# Patient Record
Sex: Female | Born: 1970 | Race: White | Hispanic: No | Smoking: Never smoker
Health system: Southern US, Community
[De-identification: ages and names within clinical notes are randomized; demographics above are authoritative.]

## PROBLEM LIST (undated history)

## (undated) DIAGNOSIS — J189 Pneumonia, unspecified organism: Secondary | ICD-10-CM

## (undated) DIAGNOSIS — M1990 Unspecified osteoarthritis, unspecified site: Secondary | ICD-10-CM

## (undated) DIAGNOSIS — Z9889 Other specified postprocedural states: Secondary | ICD-10-CM

## (undated) DIAGNOSIS — E538 Deficiency of other specified B group vitamins: Secondary | ICD-10-CM

## (undated) DIAGNOSIS — R112 Nausea with vomiting, unspecified: Secondary | ICD-10-CM

## (undated) DIAGNOSIS — E039 Hypothyroidism, unspecified: Secondary | ICD-10-CM

## (undated) DIAGNOSIS — J329 Chronic sinusitis, unspecified: Secondary | ICD-10-CM

## (undated) DIAGNOSIS — R011 Cardiac murmur, unspecified: Secondary | ICD-10-CM

## (undated) DIAGNOSIS — Z889 Allergy status to unspecified drugs, medicaments and biological substances status: Secondary | ICD-10-CM

## (undated) DIAGNOSIS — I341 Nonrheumatic mitral (valve) prolapse: Secondary | ICD-10-CM

## (undated) HISTORY — PX: WISDOM TOOTH EXTRACTION: SHX21

## (undated) HISTORY — DX: Cardiac murmur, unspecified: R01.1

## (undated) HISTORY — DX: Unspecified osteoarthritis, unspecified site: M19.90

## (undated) HISTORY — DX: Chronic sinusitis, unspecified: J32.9

## (undated) HISTORY — DX: Allergy status to unspecified drugs, medicaments and biological substances: Z88.9

## (undated) HISTORY — DX: Nonrheumatic mitral (valve) prolapse: I34.1

## (undated) HISTORY — DX: Deficiency of other specified B group vitamins: E53.8

## (undated) HISTORY — PX: CYSTECTOMY: SUR359

## (undated) HISTORY — DX: Pneumonia, unspecified organism: J18.9

---

## 1992-06-16 HISTORY — PX: OTHER SURGICAL HISTORY: SHX169

## 1992-06-16 HISTORY — PX: TIBIA FRACTURE SURGERY: SHX806

## 1998-06-28 ENCOUNTER — Other Ambulatory Visit: Admission: RE | Admit: 1998-06-28 | Discharge: 1998-06-28 | Payer: Self-pay | Admitting: Obstetrics and Gynecology

## 1998-09-25 ENCOUNTER — Other Ambulatory Visit: Admission: RE | Admit: 1998-09-25 | Discharge: 1998-09-25 | Payer: Self-pay | Admitting: *Deleted

## 1998-10-23 ENCOUNTER — Inpatient Hospital Stay (HOSPITAL_COMMUNITY): Admission: AD | Admit: 1998-10-23 | Discharge: 1998-10-23 | Payer: Self-pay | Admitting: Obstetrics and Gynecology

## 1999-01-11 ENCOUNTER — Inpatient Hospital Stay (HOSPITAL_COMMUNITY): Admission: AD | Admit: 1999-01-11 | Discharge: 1999-01-11 | Payer: Self-pay | Admitting: Obstetrics and Gynecology

## 1999-02-09 ENCOUNTER — Inpatient Hospital Stay (HOSPITAL_COMMUNITY): Admission: AD | Admit: 1999-02-09 | Discharge: 1999-02-12 | Payer: Self-pay | Admitting: Obstetrics & Gynecology

## 1999-02-26 ENCOUNTER — Encounter (HOSPITAL_COMMUNITY): Admission: RE | Admit: 1999-02-26 | Discharge: 1999-05-27 | Payer: Self-pay | Admitting: Obstetrics & Gynecology

## 2000-01-21 ENCOUNTER — Other Ambulatory Visit: Admission: RE | Admit: 2000-01-21 | Discharge: 2000-01-21 | Payer: Self-pay | Admitting: Obstetrics and Gynecology

## 2000-02-13 ENCOUNTER — Encounter: Admission: RE | Admit: 2000-02-13 | Discharge: 2000-02-13 | Payer: Self-pay | Admitting: Family Medicine

## 2000-02-27 ENCOUNTER — Encounter: Admission: RE | Admit: 2000-02-27 | Discharge: 2000-02-27 | Payer: Self-pay | Admitting: Family Medicine

## 2000-02-27 ENCOUNTER — Encounter: Payer: Self-pay | Admitting: Family Medicine

## 2000-05-15 ENCOUNTER — Encounter (INDEPENDENT_AMBULATORY_CARE_PROVIDER_SITE_OTHER): Payer: Self-pay | Admitting: *Deleted

## 2000-05-15 ENCOUNTER — Ambulatory Visit (HOSPITAL_BASED_OUTPATIENT_CLINIC_OR_DEPARTMENT_OTHER): Admission: RE | Admit: 2000-05-15 | Discharge: 2000-05-15 | Payer: Self-pay | Admitting: Otolaryngology

## 2002-06-15 ENCOUNTER — Encounter: Payer: Self-pay | Admitting: Otolaryngology

## 2002-06-15 ENCOUNTER — Encounter: Admission: RE | Admit: 2002-06-15 | Discharge: 2002-06-15 | Payer: Self-pay | Admitting: Otolaryngology

## 2002-06-28 ENCOUNTER — Other Ambulatory Visit: Admission: RE | Admit: 2002-06-28 | Discharge: 2002-06-28 | Payer: Self-pay | Admitting: Obstetrics and Gynecology

## 2003-11-22 ENCOUNTER — Other Ambulatory Visit: Admission: RE | Admit: 2003-11-22 | Discharge: 2003-11-22 | Payer: Self-pay | Admitting: Obstetrics and Gynecology

## 2004-11-27 ENCOUNTER — Other Ambulatory Visit: Admission: RE | Admit: 2004-11-27 | Discharge: 2004-11-27 | Payer: Self-pay | Admitting: Obstetrics and Gynecology

## 2005-06-16 DIAGNOSIS — J189 Pneumonia, unspecified organism: Secondary | ICD-10-CM

## 2005-06-16 HISTORY — DX: Pneumonia, unspecified organism: J18.9

## 2005-07-12 ENCOUNTER — Inpatient Hospital Stay (HOSPITAL_COMMUNITY): Admission: EM | Admit: 2005-07-12 | Discharge: 2005-07-15 | Payer: Self-pay | Admitting: Emergency Medicine

## 2005-08-11 ENCOUNTER — Ambulatory Visit: Payer: Self-pay | Admitting: Cardiology

## 2005-08-12 ENCOUNTER — Inpatient Hospital Stay (HOSPITAL_COMMUNITY): Admission: EM | Admit: 2005-08-12 | Discharge: 2005-08-21 | Payer: Self-pay | Admitting: Emergency Medicine

## 2005-08-15 ENCOUNTER — Ambulatory Visit: Payer: Self-pay | Admitting: Pulmonary Disease

## 2005-09-25 ENCOUNTER — Encounter (INDEPENDENT_AMBULATORY_CARE_PROVIDER_SITE_OTHER): Payer: Self-pay | Admitting: Specialist

## 2005-09-25 ENCOUNTER — Ambulatory Visit (HOSPITAL_COMMUNITY): Admission: RE | Admit: 2005-09-25 | Discharge: 2005-09-25 | Payer: Self-pay | Admitting: Otolaryngology

## 2006-06-16 HISTORY — PX: NASAL SINUS SURGERY: SHX719

## 2007-02-18 ENCOUNTER — Encounter (INDEPENDENT_AMBULATORY_CARE_PROVIDER_SITE_OTHER): Payer: Self-pay | Admitting: Obstetrics and Gynecology

## 2007-02-18 ENCOUNTER — Ambulatory Visit (HOSPITAL_COMMUNITY): Admission: RE | Admit: 2007-02-18 | Discharge: 2007-02-18 | Payer: Self-pay | Admitting: Obstetrics and Gynecology

## 2007-03-23 ENCOUNTER — Encounter: Admission: RE | Admit: 2007-03-23 | Discharge: 2007-03-23 | Payer: Self-pay | Admitting: Obstetrics and Gynecology

## 2007-10-22 ENCOUNTER — Encounter: Admission: RE | Admit: 2007-10-22 | Discharge: 2007-10-22 | Payer: Self-pay | Admitting: Obstetrics and Gynecology

## 2008-04-24 ENCOUNTER — Encounter: Admission: RE | Admit: 2008-04-24 | Discharge: 2008-04-24 | Payer: Self-pay | Admitting: Obstetrics and Gynecology

## 2009-07-25 ENCOUNTER — Ambulatory Visit (HOSPITAL_COMMUNITY): Admission: RE | Admit: 2009-07-25 | Discharge: 2009-07-25 | Payer: Self-pay | Admitting: Obstetrics and Gynecology

## 2010-04-08 ENCOUNTER — Ambulatory Visit: Payer: Self-pay | Admitting: Cardiology

## 2010-04-08 ENCOUNTER — Encounter: Payer: Self-pay | Admitting: Cardiology

## 2010-04-08 DIAGNOSIS — R05 Cough: Secondary | ICD-10-CM | POA: Insufficient documentation

## 2010-04-08 DIAGNOSIS — R072 Precordial pain: Secondary | ICD-10-CM | POA: Insufficient documentation

## 2010-04-08 DIAGNOSIS — R059 Cough, unspecified: Secondary | ICD-10-CM | POA: Insufficient documentation

## 2010-04-26 ENCOUNTER — Ambulatory Visit: Payer: Self-pay | Admitting: Internal Medicine

## 2010-04-30 ENCOUNTER — Telehealth: Payer: Self-pay | Admitting: Internal Medicine

## 2010-04-30 ENCOUNTER — Ambulatory Visit: Payer: Self-pay | Admitting: Internal Medicine

## 2010-05-01 ENCOUNTER — Telehealth: Payer: Self-pay | Admitting: Internal Medicine

## 2010-05-06 ENCOUNTER — Encounter: Payer: Self-pay | Admitting: Internal Medicine

## 2010-05-06 ENCOUNTER — Ambulatory Visit (HOSPITAL_COMMUNITY): Admission: RE | Admit: 2010-05-06 | Discharge: 2010-05-06 | Payer: Self-pay | Admitting: Internal Medicine

## 2010-05-29 ENCOUNTER — Telehealth: Payer: Self-pay | Admitting: Internal Medicine

## 2010-05-31 ENCOUNTER — Ambulatory Visit: Payer: Self-pay

## 2010-05-31 ENCOUNTER — Ambulatory Visit: Payer: Self-pay | Admitting: Cardiology

## 2010-06-13 ENCOUNTER — Encounter: Payer: Self-pay | Admitting: Internal Medicine

## 2010-07-16 NOTE — Assessment & Plan Note (Addendum)
Summary: ec6/ chestpain   Visit Type:  Initial Consult Primary Oluwatomisin Hustead:  Fuller Mandril  CC:  chest pain.  History of Present Illness: Over the past month and one half, has had episodes of chest pain, and she would get some tightness in the chest.  She would also note that heart rate was not regular.  She felt she could not deep breath, and she would notice pain in neck.  She had blood work at [employer].  She can be sitting at her desk, and her heart went into an irregular rhythm.  It will only last seconds.   She has an 40 year old.  She has an IUD.   She will have spots of pain in her back, and they will come and go.  She will feel like she cannot take a deep breath.  She has not been exercising for the past three months.  Her job is stressful.   She has had a productive cough for about a year.   Patient has never smoked.  She wants to see pulmonary medicine.  Of note, she had a complex admission in 2007 in which she presented with bilateral infiltrates thought in part related to her recurrent sinusitis.  She was on steroids, had steroid induced hyperglycemia, and required nearly three weeks of antibiotics during that hospitalization.  She has seen Dr. Doristine Counter.  The cough seems to be persistent, at times productive.  She has had four separate ENT MDs.  The chest pain is vague, somewhat worse with a breath, and associated with neck pain.  Prior echo here was normal in 2004.    Current Medications (verified): 1)  No Meds  Allergies (verified): 1)  ! * All Quinolones 2)  ! Erythromycin 3)  ! Zithromax 4)  ! Morphine  Past History:  Past Medical History: Last updated: 04/04/2010  Chest pain  Acute-on-chronic sinusitis.  Remote history of exercise-induced asthma.  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  The patient denies  anorexia, fever, weight loss, weight gain, vision loss, decreased hearing, hoarseness, syncope, dyspnea on exertion, peripheral edema, headaches, hemoptysis, abdominal pain, melena, hematochezia, severe indigestion/heartburn, hematuria, incontinence, suspicious skin lesions, difficulty walking, depression, abnormal bleeding, and enlarged lymph nodes.    Vital Signs:  Patient profile:   40 year old female Height:      62 inches Weight:      156 pounds BMI:     28.64 O2 Sat:      100 % on Room air Pulse rate:   57 / minute Pulse rhythm:   regular Resp:     18 per minute BP sitting:   110 / 68  (left arm) Cuff size:   large  Vitals Entered By: Vikki Ports (April 08, 2010 2:29 PM)  O2 Flow:  Room air  Physical Exam  General:  Well developed, well nourished, in no acute distress. Head:  normocephalic and atraumatic Eyes:  PERRLA/EOM intact; conjunctiva and lids normal. Neck:  Supple.  No def bruits noted.   Lungs:  No rales.  No definite wheezes today.   Heart:  PMI nondisplaced. Normal S1 and s2.  No def rub, or gallop Abdomen:  Bowel sounds positive; abdomen soft and non-tender without masses, organomegaly, or hernias noted. No hepatosplenomegaly. Pulses:  pulses normal in all 4 extremities Extremities:  No clubbing or cyanosis. Neurologic:  Alert and oriented x 3.   EKG  Procedure date:  04/08/2010  Findings:      SB. WNL.   Impression & Recommendations:  Problem # 1:  CHEST PAIN, PRECORDIAL (ICD-786.51) The chest pain is somewhat atypical, and her risk factor profile is not high.  She has had a prior echo which was normal.  Will do limited stress test and do echocardiogram to exclude RV enlargment, or other findings.  I think she would benefit from reevaluation by pulmonary medicine given her complex history with sinusitis, pneuomonia, and complex of symptoms.  Index of suspicion for CAD is not high, but will do screening treadmill, check CXR, and refer to pulmonary  medicine.  Reviewed with patient in detail  Orders: EKG w/ Interpretation (93000) Echocardiogram (Echo) Treadmill (Treadmill) Pulmonary Referral (Pulmonary) T-2 View CXR (71020TC)  Problem # 2:  COUGH (ICD-786.2) Will get CXR and refer to pulmonary.  Has one and one half year history of recurrent cough, multiple sinus surgeries.   Orders: EKG w/ Interpretation (93000) Echocardiogram (Echo) Treadmill (Treadmill) Pulmonary Referral (Pulmonary) T-2 View CXR (71020TC)  Patient Instructions: 1)  You have been referred to Dr Marchelle Gearing in Pulmonary.  2)  Your physician has requested that you have an echocardiogram.  Echocardiography is a painless test that uses sound waves to create images of your heart. It provides your doctor with information about the size and shape of your heart and how well your heart's chambers and valves are working.  This procedure takes approximately one hour. There are no restrictions for this procedure. 3)  Your physician has requested that you have an exercise tolerance test.  For further information please visit [email or web address]  Please also follow instruction sheet, as given. 4)  A chest x-ray will be obtained.   Appended Document: ec6/ chestpain Thanks Tom. I will see her. I will cc to [facility] my nurse  Appended Document: ec6/ chestpain pt set to see MR on 04-26-10.

## 2010-07-16 NOTE — Assessment & Plan Note (Signed)
Summary: per dr stuckey/asthma/mhh   Visit Type:  Initial Consult Copy to:  Dr. Riley Kill Primary Provider/Referring Provider:  Fuller Mandril, [facility] in [place], Dr Pollyann Kennedy - ENT, Dr. Marcelle Overlie - OB, Dr Aris Georgia - Allergist, Dr Chrissie Noa Ward - [facility]  CC:  Pulmonary Consult for chronic cough x 1 year. Marland Kitchen  History of Present Illness: IOV 04/26/2010: 40 year old referred by Dr. Riley Kill for question of asthma. Patient has chronic cough for over 1 year. Insidious onset. Productive. Asspociated sputum present that even at baseline is yellow or clear or green. She feels it is a chest sputum. Stable since onset. Cough worst in day. Never bothers her sleep. No nocturnal cough. cough is variable in intensity day by day basis. Overall rates cough as moderte in intensity. Constantly coughs at work - finds it embarrassing. No clear cut aggravating factors. No clear cut relieving factors. Denies associated wheezing, or dyspnea although does have associated atypical chest pains with palpitations (episodic) and chest tightness. Saw Dr. Riley Kill 04/08/2010 - stress test is pending later this month. She also recollects some neck pain with inspiration in October that was transitory and lasted a week  Of note, she has had 4 sinus surgeries. First one in 2004. Most recent one in 2010. Known chronic pan-sinusitis.  Recollects skin test for allergies at age 13 - POSITIVE per hx. Also age [age] played soccer  - would hyperventilate and MDIs would help. Diagnosed as Exercise Induced asthma. Also, feels constant sinus drainage. Denies GERD. Not on ace inhibitor. Also 2007 admitted for pneumonia and then had multiple drug allergies. Also, drinks lot of coffee daily but denies frank GERD symptoms  Preventive Screening-Counseling & Management  Alcohol-Tobacco     Smoking Status: never  Caffeine-Diet-Exercise     Caffeine use/day: 4 cups  Current Medications (verified): 1)  No Meds  Allergies  (verified): 1)  ! * All Quinolones 2)  ! Erythromycin 3)  ! Zithromax 4)  ! Morphine  Past History:  Past Medical History:  Chest pain...DR Riley Kill  Acute-on-chronic sinusitis. Remote history of exercise-induced asthma. Pneumonia in 2007, hospitla x 2 weeks Multiple drug allergies  - FActive - "nearly killed me" in 2007 at admission. 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  Community-acquired pneumonia.  2.  Acute-on-chronic sinusitis.  3.  Steroid-induced hyperglycemia.  4.  Remote history of exercise-induced asthma.  5.  Multiple medication allergies.  Family History:  Her father has diabetes. Her mother, maternal uncle, and  grandmother all have hypertension. She has a sister who had thyroid cancer. MOm  - COPD, smokes Dad - smokes  Social History: Drinks lot of coffee.  Does not drink alcohol.  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  Married 1 daughter - born 40. She is healthy Smoking Status:  never Caffeine use/day:  4 cups  Review of Systems       The patient complains of productive cough, non-productive cough, chest pain, irregular heartbeats, abdominal pain, and change in color of mucus.  The patient denies shortness of breath with activity, shortness of breath at rest, coughing up blood, acid heartburn, indigestion, loss of appetite, weight change, difficulty swallowing, sore throat, tooth/dental problems, headaches, nasal congestion/difficulty breathing through nose, sneezing, itching, ear ache,  anxiety, depression, hand/feet swelling, joint stiffness or pain, rash, and fever.    Vital Signs:  Patient profile:   40 year old female Height:      62 inches Weight:      159.25 pounds BMI:     29.23 O2 Sat:      99 % on Room air Temp:     98.3 degrees F oral Pulse rate:   77 / minute BP sitting:   120 / 80  (right arm) Cuff size:   regular  Vitals Entered By: Carron Curie CMA (April 26, 2010 3:31 PM)  O2 Flow:  Room air CC: Pulmonary Consult for chronic cough x 1 year.  Comments Medications reviewed with patient Carron Curie CMA  April 26, 2010 3:33 PM Daytime phone number verified with patient.    Physical Exam  General:  well developed, well nourished, in no acute distress Head:  normocephalic and atraumatic Eyes:  PERRLA/EOM intact; conjunctiva and sclera clear Ears:  TMs intact and clear with normal canals Nose:  no deformity, discharge, inflammation, or lesions Mouth:  no deformity or lesions Neck:  no masses, thyromegaly, or abnormal cervical nodes Chest Wall:  no deformities noted Lungs:  clear bilaterally to auscultation and percussion Heart:  regular rate and rhythm, S1, S2 without murmurs, rubs, gallops, or clicks Abdomen:  bowel sounds positive; abdomen soft and non-tender without masses, or organomegaly Msk:  no deformity or scoliosis noted with normal posture Pulses:  pulses normal Extremities:  no clubbing, cyanosis, edema, or deformity noted Neurologic:  CN II-XII grossly intact with normal reflexes, coordination, muscle strength and tone Skin:  intact without lesions or rashes Cervical Nodes:  no significant adenopathy Axillary Nodes:  no significant adenopathy Psych:  alert and cooperative; normal mood and affect; normal attention span and concentration   CXR  Procedure date:  04/08/2010  Findings:       Clinical Data: Cough.  Chest pain.    CHEST - 2 VIEW    Comparison: 09/24/2005    Findings: Heart size is normal.   Mediastinal shadows are normal.   The pulmonary vascularity is normal.  Lungs are clear.  No   effusions.  No bony abnormalities.    IMPRESSION:   Normal chest    Read By:  Thomasenia Sales,  M.D.   Released By:  Thomasenia Sales,  M.D.   Comments:      reviewed  Impression & Recommendations:  Problem # 1:  COUGH (ICD-786.2) Assessment New  sinus drainage, possible asthma, possible acid reflux are likely causes for your cough. Combined they are called LARYNGOPHARYGNEAL REFLUX (LPR)  PLAN   - start daily netti pot wash via the pot  - have full PFT breathing test - once this completed, ket [place] know  - depending on breathing test result, you might need additiional test called methacholine challenge test  - call if any questions  - followup depending on test results.  - eliminate coffee if you can for next month  Orders: Consultation Level V (32202)  Patient Instructions: 1)  sinus drainage, possible asthma, possible acid reflux are likely causes for your cough 2)   - start daily netti pot wash via the pot 3)   - have full PFT  breathing test - once this completed, ket [place] know 4)   - depending on breathing test result, you might need additiional test called methacholine challenge test  5)  - call if any questions 6)   - followup depending on test results. 7)   - eliminate coffee if you can for next month   Immunization History:  Influenza Immunization History:    Influenza:  historical (04/16/2010)  Pneumovax Immunization History:    Pneumovax:  historical (04/19/2007)

## 2010-07-16 NOTE — Progress Notes (Signed)
Summary: needs methacholine  Phone Note Call from Patient Call back at [phone]   Caller: Patient Call For: Anabel Lykins Summary of Call: calling for pft results Initial call taken by: Rickard Patience,  May 01, 2010 1:41 PM  Follow-up for Phone Call        I informed patient of MR's recs from phone note created last night by MR-will have MR call and discuss test results and recs with patient-pt will be in a training class from 230pm to 330pm today-please call the given number after 330pm. Thanks.Reynaldo Minium CMA  May 01, 2010 2:22 PM   Additional Follow-up for Phone Call Additional follow up Details #1::        pft normal.. She needs methacholine. I spoke to Dr. Aris Georgia who based on her memory of this Dr. Stevphen Rochester patient felt it would be okay for the same with informed consent. I spoke to patient. Explained she cannot be pregnant or have heart disease. - she agrees to both. She will have test. Explained our experience so far wiht testing. She should fu with me after test Additional Follow-up by: Kalman Shan MD,  May 01, 2010 6:34 PM

## 2010-07-16 NOTE — Miscellaneous (Signed)
Summary: Orders Update pft charges  Clinical Lists Changes  Orders: Added new Service order of Carbon Monoxide diffusing w/capacity (94720) - Signed Added new Service order of Lung Volumes (94240) - Signed Added new Service order of Spirometry (Pre & Post) (94060) - Signed 

## 2010-07-16 NOTE — Progress Notes (Signed)
Summary: pft normal  Phone Note Outgoing Call   Summary of Call: PFT normal. Needs methacholine challenge. Called her home number - lmtcb. Called office - did not leave message. Please inform her she needs methacholine. IF she wants to discuss that test, I can explain it to her. cannot be pregnant for test. Also, I ned to talk to her allergist Dr. Aris Georgia tomorrow first before ordering methacholine. Can you get hold of her for me please Initial call taken by: Kalman Shan MD,  April 30, 2010 5:59 PM  Follow-up for Phone Call        will anser in other phone note. d/.w Dr Aris Georgia - who based on her recollection ofpatient who is actually Dr Stevphen Rochester patient feels tht with informed consent should be okay to do methacholine challenge test Follow-up by: Kalman Shan MD,  May 01, 2010 3:17 PM

## 2010-07-18 NOTE — Progress Notes (Signed)
Summary: MCT results in look-at-  Phone Note Other Incoming   Caller: Dr. Marchelle Gearing Summary of Call: Spoke with MR and he asked the pt was supposed to have methacoline test but he has not seen any results for this. According to order MCT was done on 05-06-10. I called pulmonary dept and they are faxing over MCT results. I have placd this in MR look-at. I will forward message to him so he is aware. Initial call taken by: Carron Curie CMA,  May 29, 2010 1:43 PM  Follow-up for Phone Call        methacholine challenge 05/06/2010 is POSITIVE for asthma. Gave results to her on cell phoone. She does not want MDI now. Wants fu after new years due to trip upcoming. Pls give her fu appt Follow-up by: Kalman Shan MD,  May 31, 2010 5:31 PM  Additional Follow-up for Phone Call Additional follow up Details #1::        LMTCBx1. Carron Curie CMA  June 04, 2010 10:45 AM pt returned call from [place]. Tivis Ringer, CNA  June 04, 2010 11:41 AM  LMTCBx2. Carron Curie CMA  June 06, 2010 2:52 PM  LMTCBx3.Carron Curie CMA  June 11, 2010 5:12 PM     Additional Follow-up for Phone Call Additional follow up Details #2::    I will sign off on message and send pt a letter. Carron Curie CMA  June 13, 2010 5:26 PM

## 2010-07-18 NOTE — Letter (Addendum)
Summary: Generic Electronics engineer Pulmonary  [address]   [place], [place] [address]   Phone: [phone]  Fax: [phone]    06/13/2010  [facility] [address] [place], [place]  [address]  Dear [name],    We have been attempting to contact you to schedule a follow-up appointment to discuss test results. Please contact our office at your earliest convenience. Thank you.           Sincerely,   [facility] Pulmonary Department  Appended Document: Generic Letter [name] [name], she is yet to make fu appt. Pls call and try to connect with her  Appended Document: Generic Letter pt seen by TP 2.13.12 for acute visit.  started on symbicort 160-4.5mcg and proair.  told to f/u w/ MR in 1 week > appt sched 2.24.12.

## 2010-07-26 ENCOUNTER — Ambulatory Visit: Payer: Self-pay | Admitting: Internal Medicine

## 2010-07-29 ENCOUNTER — Ambulatory Visit (INDEPENDENT_AMBULATORY_CARE_PROVIDER_SITE_OTHER)
Admission: RE | Admit: 2010-07-29 | Discharge: 2010-07-29 | Disposition: A | Discharge status: Home / Self Care | Payer: 59 | Source: Ambulatory Visit | Attending: Internal Medicine | Admitting: Internal Medicine

## 2010-07-29 ENCOUNTER — Ambulatory Visit (INDEPENDENT_AMBULATORY_CARE_PROVIDER_SITE_OTHER): Payer: 59 | Admitting: Adult Health

## 2010-07-29 ENCOUNTER — Telehealth: Payer: Self-pay | Admitting: Adult Health

## 2010-07-29 ENCOUNTER — Encounter: Payer: Self-pay | Admitting: Adult Health

## 2010-07-29 ENCOUNTER — Other Ambulatory Visit: Payer: Self-pay | Admitting: Internal Medicine

## 2010-07-29 DIAGNOSIS — R05 Cough: Secondary | ICD-10-CM

## 2010-07-29 DIAGNOSIS — R059 Cough, unspecified: Secondary | ICD-10-CM

## 2010-07-29 DIAGNOSIS — J45901 Unspecified asthma with (acute) exacerbation: Secondary | ICD-10-CM

## 2010-07-30 DIAGNOSIS — J45901 Unspecified asthma with (acute) exacerbation: Secondary | ICD-10-CM | POA: Insufficient documentation

## 2010-08-05 ENCOUNTER — Ambulatory Visit (INDEPENDENT_AMBULATORY_CARE_PROVIDER_SITE_OTHER): Payer: 59 | Admitting: Internal Medicine

## 2010-08-05 ENCOUNTER — Encounter: Payer: Self-pay | Admitting: Internal Medicine

## 2010-08-05 DIAGNOSIS — J45901 Unspecified asthma with (acute) exacerbation: Secondary | ICD-10-CM

## 2010-08-07 NOTE — Progress Notes (Signed)
Summary: side effects from breathign treatment  Phone Note Call from Patient   Caller: Patient Call For: Alliah Boulanger p Summary of Call: Patient had a breathing treatment this morning and she was dizzy when she was here but now she feels completed exhausted and she feels like she can barely keep her eyes open her lips were tingling but that is getting better. Patient can be reached at [phone] she wants to make sure this is normal Initial call taken by: Vedia Coffer,  July 29, 2010 1:32 PM  Follow-up for Phone Call        xopenex neb should not have caused that- can cause dizziness /tingling  encourage her to drink plently of fluids and rest  if not improving call back or ER if worse. Please contact office for sooner follow up if symptoms do not improve or worsen  xray was nml. Follow-up by: Rubye Oaks NP,  July 29, 2010 2:41 PM  Additional Follow-up for Phone Call Additional follow up Details #1::        pt advised of recs. Carron Curie CMA  July 29, 2010 3:04 PM

## 2010-08-07 NOTE — Assessment & Plan Note (Signed)
Summary: Acute NP office visit - asthmatic bronchitis   Copy to:  Dr. Riley Kill Primary Provider/Referring Provider:  Fuller Mandril, [facility] in [place], Dr Pollyann Kennedy - ENT, Dr. Marcelle Overlie - OB, Dr Aris Georgia - Allergist, Dr Chrissie Noa Ward - [facility]  CC:  wheezing, DOE, prod cough with green/yellow mucus, pain in mid back w/ inhalation x63month - was given augmentin, pred taper, and and hydromet by PCP 2days ago.  History of Present Illness: IOV 04/26/2010: 40 year old referred by Dr. Riley Kill for question of asthma. Patient has chronic cough for over 1 year. Insidious onset. Productive. Asspociated sputum present that even at baseline is yellow or clear or green. She feels it is a chest sputum. Stable since onset. Cough worst in day. Never bothers her sleep. No nocturnal cough. cough is variable in intensity day by day basis. Overall rates cough as moderte in intensity. Constantly coughs at work - finds it embarrassing. No clear cut aggravating factors. No clear cut relieving factors. Denies associated wheezing, or dyspnea although does have associated atypical chest pains with palpitations (episodic) and chest tightness. Saw Dr. Riley Kill 04/08/2010 - stress test is pending later this month. She also recollects some neck pain with inspiration in October that was transitory and lasted a week Of note, she has had 4 sinus surgeries. First one in 2004. Most recent one in 2010. Known chronic pan-sinusitis.  Recollects skin test for allergies at age 55 - POSITIVE per hx. Also age [age] played soccer  - would hyperventilate and MDIs would help. Diagnosed as Exercise Induced asthma. Also, feels constant sinus drainage. Denies GERD. Not on ace inhibitor. Also 2007 admitted for pneumonia and then had multiple drug allergies. Also, drinks lot of coffee daily but denies frank GERD symptoms  July 29, 2010 --Presents for an acute office visit. Complains of wheezing, DOE, prod cough with green/yellow  mucus, pain in mid back w/ inhalation x78month. Seen by PCP and  was given augmentin, pred taper, and hydromet  2days ago. Also seen by ENT this am. Labs done. Pt augmentin extended for supsected sinusitis and bronchitis.. Wheezing worse last 2 days. Denies chest pain,  orthopnea, hemoptysis,  n/v/d, edema, headache.Seen last ov with positive Methacholine test,however did not follow up for recommendations to schedule conflicts.  Medications Prior to Update: 1)  No Meds  Current Medications (verified): 1)  Symbicort 160-4.5 Mcg/act Aero (Budesonide-Formoterol Fumarate) .... 2 Puffs Two Times A Day 2)  Epipen 2-Pak 0.3 Mg/0.63ml Devi (Epinephrine) .... Use As Directed For Severe Allergic Reaction 3)  Proair Hfa 108 (90 Base) Mcg/act Aers (Albuterol Sulfate) .... 2 Puffs Every 4hr As Needed Wheezing  Allergies (verified): 1)  ! * All Quinolones 2)  ! Erythromycin 3)  ! Zithromax 4)  ! Morphine 5)  ! Rocephin  Past History:  Past Medical History: Last updated: 04/26/2010  Chest pain...DR Riley Kill  Acute-on-chronic sinusitis. Remote history of exercise-induced asthma. Pneumonia in 2007, hospitla x 2 weeks Multiple drug allergies  - FActive - "nearly killed me" in 2007 at admission. 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 Her mother, maternal uncle, and  grandmother all  have hypertension. She has a sister who had thyroid cancer. MOm  - COPD, smokes Dad - smokes  Social History: Last updated: 04/26/2010 Drinks lot of coffee.  Does not drink alcohol.  She works as an Scientist, water quality.  - Corporate treasurer  for [employer]  Patient never smoked.  Married 1 daughter - born 15. She is healthy  Past Pulmonary History:  Pulmonary History: DATE OF ADMISSION:  08/11/2005  DATE OF DISCHARGE:  08/21/2005  1.  Community-acquired pneumonia.  2.  Acute-on-chronic sinusitis.  3.  Steroid-induced hyperglycemia.  4.  Remote history of exercise-induced asthma.  5.  Multiple medication allergies.  Review of Systems      See HPI  Vital Signs:  Patient profile:   40 year old female Height:      62 inches Weight:      157.13 pounds BMI:     28.84 O2 Sat:      99 % on Room air Temp:     99.1 degrees F oral Pulse rate:   87 / minute BP sitting:   148 / 84  (left arm) Cuff size:   regular  Vitals Entered By: Boone Master CNA/MA (July 29, 2010 11:25 AM)  O2 Flow:  Room air CC: wheezing, DOE, prod cough with green/yellow mucus, pain in mid back w/ inhalation x37month - was given augmentin, pred taper, and hydromet by PCP 2days ago Is Patient Diabetic? No Comments Medications reviewed with patient Daytime contact number verified with patient. Boone Master CNA/MA  July 29, 2010 11:26 AM    Physical Exam  Additional Exam:  GEN: A/Ox3; pleasant , NAD HEENT:  [place]/AT, , EACs-clear, TMs-wnl, NOSE-clear, THROAT-clear NECK:  Supple w/ fair ROM; no JVD; normal carotid impulses w/o bruits; no thyromegaly or nodules palpated; no lymphadenopathy. RESP  Coarse BS w/ few exp wheeizng  CARD:  RRR, no m/r/g   GI:   Soft & nt; nml bowel sounds; no organomegaly or masses detected. [place]: Warm bil,  no calf tenderness edema, clubbing, pulses intact Neuro: intact w/no focal deficits noted.   Impression & Recommendations:  Problem # 1:  ASTHMA UNSPECIFIED WITH EXACERBATION (ICD-493.92) Exacerbation with associated bronchitis and sinusitis-slow to resolve Plan: Finish Augmentin and prednisone as directed.  Delsym as needed cough Begin Symbicort 160/4.92mcg 2 puffs two times a day -brush  rinse and gargle after use.  ProAir or Xopenex 2 puffs every 4hr as needed wheeizng or shortness of breath.  follow up 1 week Dr. Marchelle Gearing and as needed  Please contact office for sooner follow up if symptoms do not improve or worsen  Orders: Est. Patient Level IV (30865)  Medications Added to Medication List This Visit: 1)  Symbicort 160-4.5 Mcg/act Aero (Budesonide-formoterol fumarate) .... 2 puffs two times a day 2)  Epipen 2-pak 0.3 Mg/0.37ml Devi (Epinephrine) .... Use as directed for severe allergic reaction 3)  Proair Hfa 108 (90 Base) Mcg/act Aers (Albuterol sulfate) .... 2 puffs every 4hr as needed wheezing  Other Orders: T-2 View CXR (71020TC) Admin of Therapeutic Inj  intramuscular or subcutaneous (78469) Depo- Medrol 80mg  (J1040) Depo- Medrol 40mg  (J1030) Albuterol Sulfate Sol 1mg  unit dose (G2952) Nebulizer Tx (84132)  Patient Instructions: 1)  Finish Augmentin and prednisone as directed.  2)  Delsym as needed cough 3)  Begin Symbicort 160/4.27mcg 2 puffs two times a day -brush rinse and gargle after use.  4)  ProAir or Xopenex 2 puffs every 4hr as needed wheeizng or shortness of breath.  5)  follow up 1 week Dr. Marchelle Gearing and as needed  6)  Please contact office for sooner follow up if symptoms do not improve or worsen  Prescriptions: PROAIR HFA 108 (90 BASE) MCG/ACT AERS (ALBUTEROL SULFATE) 2 puffs every 4hr as needed wheezing  #1 x 2   Entered and Authorized by:   Rubye Oaks NP   Signed by:   Rubye Oaks NP on 07/29/2010   Method used:   Electronically to        [facility]* (retail)       [address]       [place], [place]  [id number]       Ph: [phone]       Fax: [phone]   RxID:   [id number]      Medication Administration  Injection # 1:    Medication: Depo- Medrol 40mg     Diagnosis: COUGH (ICD-786.2)    Route: IM    Site: RUOQ gluteus    Exp Date: 12-2012    Lot #: [id number]    Mfr: Pharmacia    Patient tolerated  injection without complications    Given by: Boone Master CNA/MA (July 29, 2010 12:37 PM)  Injection # 2:    Medication: Depo- Medrol 80mg     Diagnosis: COUGH (ICD-786.2)    Route: IM    Site: RUOQ gluteus    Exp Date: 12-2012    Lot #: [id number]    Mfr: Pharmacia    Patient tolerated injection without complications    Given by: Boone Master CNA/MA (July 29, 2010 12:37 PM)  Medication # 1:    Medication: Albuterol Sulfate Sol 1mg  unit dose    Diagnosis: COUGH (ICD-786.2)    Dose: 1 vial    Route: inhaled    Exp Date: 07-2011    Lot #: [id number]    Mfr: nephron    Patient tolerated medication without complications    Given by: Boone Master CNA/MA (July 29, 2010 12:38 PM)  Orders Added: 1)  T-2 View CXR [71020TC] 2)  Admin of Therapeutic Inj  intramuscular or subcutaneous [96372] 3)  Depo- Medrol 80mg  [J1040] 4)  Depo- Medrol 40mg  [J1030] 5)  Albuterol Sulfate Sol 1mg  unit dose [J7613] 6)  Nebulizer Tx [94640] 7)  Est. Patient Level IV [24401]

## 2010-08-09 ENCOUNTER — Ambulatory Visit: Payer: Self-pay | Admitting: Internal Medicine

## 2010-08-13 NOTE — Assessment & Plan Note (Signed)
Summary: congestion//jd   Visit Type:  IME Initial Copy to:  Dr. Riley Kill Primary Provider/Referring Provider:  Fuller Mandril, [facility] in [place], Dr Pollyann Kennedy - ENT, Dr. Marcelle Overlie - OB, Dr Aris Georgia - Allergist, Dr Chrissie Noa Ward - [facility], Dr Lucie Leather - Allergy, Dr [name] - ENT, Dr Marchelle Gearing - Pulmonary  CC:  Pt c/o low grade fever and pt c/o chest tightness and swelling i nher tongue due to abx.  pt also c/o palpatations as well since starting prednisone. [name] [name]  History of Present Illness: IOV 04/26/2010: 40 year old referred by Dr. Riley Kill for question of asthma. Patient has chronic cough for over 1 year. Insidious onset. Productive. Asspociated sputum present that even at baseline is yellow or clear or green. She feels it is a chest sputum. Stable since onset. Cough worst in day. Never bothers her sleep. No nocturnal cough. cough is variable in intensity day by day basis. Overall rates cough as moderte in intensity. Constantly coughs at work - finds it embarrassing. No clear cut aggravating factors. No clear cut relieving factors. Denies associated wheezing, or dyspnea although does have associated atypical chest pains with palpitations (episodic) and chest tightness. Saw Dr. Riley Kill 04/08/2010 - stress test is pending later this month. She also recollects some neck pain with inspiration in October that was transitory and lasted a week Of note, she has had 4 sinus surgeries. First one in 2004. Most recent one in 2010. Known chronic pan-sinusitis.  Recollects skin test for allergies at age 88 - POSITIVE per hx. Also age [age] played soccer  - would hyperventilate and MDIs would help. Diagnosed as Exercise Induced asthma. Also, feels constant sinus drainage. Denies GERD. Not on ace inhibitor. Also 2007 admitted for pneumonia and then had multiple drug allergies. Also, drinks lot of coffee daily but denies frank GERD symptoms  July 29, 2010 --Presents for an acute office visit.  Complains of wheezing, DOE, prod cough with green/yellow mucus, pain in mid back w/ inhalation x11month. Seen by PCP and  was given augmentin, pred taper, and hydromet  2days ago. Also seen by ENT this am. Labs done. Pt augmentin extended for supsected sinusitis and bronchitis.. Wheezing worse last 2 days. Denies chest pain,  orthopnea, hemoptysis,  n/v/d, edema, headache.Seen last ov with positive Methacholine test,however did not follow up for recommendations to schedule conflicts.  REC; [facility]  August 05, 2010: Followup afer NP visit one week ago. Still has few days of prednisone left. Taking symbicort. Feels asthma part and wheeze and cough are better. But felt she has reacted to augmenting with dyspnea, throat closure, tongue swelling and chest tightness. She has stopped it. She is due to see Dr. Lucie Leather to discus her allergy issues. There is some associated palpitations too.    Preventive Screening-Counseling & Management  Alcohol-Tobacco     Smoking Status: never  Caffeine-Diet-Exercise     Caffeine use/day: 4 cups  Current Medications (verified): 1)  Symbicort 160-4.5 Mcg/act Aero (Budesonide-Formoterol Fumarate) .... 2 Puffs Two Times A Day 2)  Epipen 2-Pak 0.3 Mg/0.71ml Devi (Epinephrine) .... Use As Directed For Severe Allergic Reaction 3)  Proair Hfa 108 (90 Base) Mcg/act Aers (Albuterol Sulfate) .... 2 Puffs Every 4hr As Needed Wheezing 4)  Nasonex 50 Mcg/act Susp (Mometasone Furoate) .... 2 Sprays Each Nostril Daily 5)  Prednisone 20 Mg Tabs (Prednisone) .... Take 1 Tablet By Mouth Once A Day  Allergies (verified): 1)  ! * All Quinolones 2)  ! Erythromycin 3)  !  Zithromax 4)  ! Morphine 5)  ! Rocephin 6)  ! Augmentin  Past History:  Past medical, surgical, family and social histories (including risk factors) reviewed, and no changes noted (except as noted below).  Past Medical History: Reviewed history from 04/26/2010 and no changes required.  Chest  pain...DR Riley Kill  Acute-on-chronic sinusitis. Remote history of exercise-induced asthma. Pneumonia in 2007, hospitla x 2 weeks Multiple drug allergies  - FActive - "nearly killed me" in 2007 at admission. Was in ICU  - Rocephin - 2007 - tongue swelling  - Zpak - rash in 2010  Past Surgical History: Reviewed history from 04/26/2010 and no changes required. 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  Community-acquired pneumonia.  2.  Acute-on-chronic sinusitis.  3.  Steroid-induced hyperglycemia.  4.  Remote history of exercise-induced asthma.  5.  Multiple medication allergies.  Family History: Reviewed history from 04/26/2010 and no changes required.  Her father has diabetes. Her mother, maternal uncle, and  grandmother all have hypertension. She has a sister who had thyroid cancer. MOm  - COPD, smokes Dad - smokes  Social History: Reviewed history from 04/26/2010 and no changes required. Drinks lot of coffee.  Does not drink alcohol.  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  Married 1 daughter - born 87. She is healthy  Review of Systems       chest tightness  Vital Signs:  Patient profile:   40 year old female Height:      62 inches Weight:      157.06 pounds BMI:     28.83 O2 Sat:      97 % on Room air Temp:     98.7 degrees F oral Pulse rate:   81 / minute BP sitting:   138 / 86  (right arm) Cuff size:   regular  Vitals Entered By: Carron Curie CMA (August 05, 2010 3:44 PM)  O2 Flow:  Room air CC: Pt c/o low grade fever, pt c/o chest tightness and swelling i nher tongue due to abx.   pt also c/o palpatations as well since starting prednisone.  Comments Medications reviewed with patient Carron Curie CMA  August 05, 2010 3:44 PM Daytime phone number verified with patient.    Physical Exam  General:  well developed, well nourished, in no acute distress Head:  normocephalic and atraumatic Eyes:  PERRLA/EOM intact; conjunctiva and sclera clear Ears:  TMs intact and clear with normal canals Nose:  no deformity, discharge, inflammation, or lesions Mouth:  no deformity or lesions Neck:  no masses, thyromegaly, or abnormal cervical nodes Chest Wall:  no deformities noted Lungs:  clear bilaterally to auscultation and percussion Heart:  regular rate and rhythm, S1, S2 without murmurs, rubs, gallops, or clicks Abdomen:  bowel sounds positive; abdomen soft and non-tender without masses, or organomegaly Msk:  no deformity or scoliosis noted with normal posture Pulses:  pulses normal Extremities:  no clubbing, cyanosis, edema, or deformity noted Neurologic:  CN II-XII grossly intact with normal reflexes, coordination, muscle strength and tone Skin:  intact without lesions or rashes Cervical Nodes:  no significant adenopathy Axillary Nodes:  no significant  adenopathy Psych:  alert and cooperative; normal mood and affect; normal attention span and concentration   Impression & Recommendations:  Problem # 1:  ASTHMA UNSPECIFIED WITH EXACERBATION (ICD-493.92) Assessment Improved improved with symbicort and prednisone but having dyspnea/chest tightness componenet independent of asthma that she feels is allregic reaction to augmenting. She is due to see Dr. Lucie Leather to discuss her allergies. In view of reported palpitations I will change symbicort to qvar and see if this makes a difference. Will see her again in 3 months  Medications Added to Medication List This Visit: 1)  Nasonex 50 Mcg/act Susp (Mometasone furoate) .... 2 sprays each nostril daily 2)  Prednisone 20 Mg  Tabs (Prednisone) .... Take 1 tablet by mouth once a day 3)  Qvar 80 Mcg/act Aers (Beclomethasone dipropionate) .... Two  puffs twice daily  Other Orders: Est. Patient Level II ([address]) Prescription Created Electronically [phone])  Patient Instructions: 1)  stop symbicort 2)  take qvar 2 puff two times a day - show technique to my nurse 3)  finish prednisone 4)  return in 3 months to report progress 5)  return sooner if needed Prescriptions: QVAR 80 MCG/ACT  AERS (BECLOMETHASONE DIPROPIONATE) Two  puffs twice daily  #1 x 6   Entered and Authorized by:   Kalman Shan MD   Signed by:   Kalman Shan MD on 08/05/2010   Method used:   Electronically to        [facility]* (retail)       [address]       [place], [place]  [id number]       Ph: [phone]       Fax: [phone]   RxID:   [id number]

## 2010-09-04 LAB — CBC
HCT: 43.3 % (ref 36.0–46.0)
Hemoglobin: 14.1 g/dL (ref 12.0–15.0)
MCHC: 32.5 g/dL (ref 30.0–36.0)
MCV: 89.3 fL (ref 78.0–100.0)
Platelets: 239 10*3/uL (ref 150–400)
RBC: 4.85 MIL/uL (ref 3.87–5.11)
RDW: 13.3 % (ref 11.5–15.5)
WBC: 8.3 10*3/uL (ref 4.0–10.5)

## 2010-09-04 LAB — PREGNANCY, URINE: Preg Test, Ur: NEGATIVE

## 2010-09-06 NOTE — Assessment & Plan Note (Signed)
[facility]                                ON-CALL NOTE  [name] [name], [name] [name]                      MRN:          [id number] DATE:08/04/2010                            DOB:          [birth year]   PRIMARY CARE PROVIDER:  Dr. Marchelle Gearing.  [name] [name] called the on-call number to note that she has been having some tingling in her throat and some tightness in her chest and possibly some welts on her neck which she associates with taking the Augmentin. This has occurred repeatedly over the past week and it has never progressed to the point that she had significant throat swelling or difficulty breathing.  She has multiple listed allergies and claims that this is similar to in the past.  I instructed the patient to take some Benadryl if the symptoms worsen, she should report immediately to the emergency room and by all means, she should stop taking Augmentin any further.  She already has a scheduled appointment for Dr. Marchelle Gearing next Friday, and she will call the clinic if that needs to be expedited.  It was again stressed to go to the emergency room if her breathing worsens.    Rogelia Rohrer, MD Electronically Signed   Clemetine Marker  DD: 08/04/2010  DT: 08/05/2010  Job #: [id number]

## 2010-09-27 ENCOUNTER — Other Ambulatory Visit: Payer: Self-pay | Admitting: Obstetrics and Gynecology

## 2010-09-27 DIAGNOSIS — N6311 Unspecified lump in the right breast, upper outer quadrant: Secondary | ICD-10-CM

## 2010-10-01 ENCOUNTER — Ambulatory Visit
Admission: RE | Admit: 2010-10-01 | Discharge: 2010-10-01 | Disposition: A | Discharge status: Home / Self Care | Payer: 59 | Source: Ambulatory Visit | Attending: Obstetrics and Gynecology | Admitting: Obstetrics and Gynecology

## 2010-10-01 DIAGNOSIS — N6311 Unspecified lump in the right breast, upper outer quadrant: Secondary | ICD-10-CM

## 2010-10-16 ENCOUNTER — Other Ambulatory Visit: Payer: 59

## 2010-10-29 NOTE — Op Note (Signed)
[name], [name] [name]               ACCOUNT NO.:  [id number]   MEDICAL RECORD NO.:  [id number]          PATIENT TYPE:  AMB   LOCATION:  [facility]                           FACILITY:  [facility]   PHYSICIAN:  Michelle L. Grewal, M.D.DATE OF BIRTH:  September 08, 1970   DATE OF PROCEDURE:  02/18/2007  DATE OF DISCHARGE:                               OPERATIVE REPORT   PREOPERATIVE DIAGNOSIS:  Left labial mass.   POSTOPERATIVE DIAGNOSIS:  Left labial mass.   PROCEDURE:  Removal left labial mass.   SURGEON:  Michelle L. Vincente Poli, M.D.   ANESTHESIA:  LMA.   FINDINGS:  A 1-cm left labial mass, sent to Pathology.   ESTIMATED BLOOD LOSS:  50 mL.   COMPLICATIONS:  None.   PROCEDURE:  Patient was taken to the operating room.  She was given  anesthesia.  She was prepped and draped in the usual sterile fashion.  An in-and-out catheter was used to empty the bladder.  Exam under  anesthesia revealed a left labial mass in approximately the 5 o'clock  position which was kind of deep that I could palpate more easily with my  finger in the vagina.  It did appear to be in a location of where the  Bartholin's gland should be.  I then decided the approach would be  easiest through the vagina, because the mass seemed to come up very  close to the vaginal epithelium.  I made a small 1.5-cm incision with a  scalpel and used Metzenbaum.  The mass appeared to be rubbery in  consistency and it was about the size of a marble and then a smaller  area attached to it as well.  I did lift it up with the Metzenbaum  scissors and removed it completely.  There was some bleeding at the base  naturally from all of the blood flow; we cauterized the base of the  incision and then closed it in a series of 2 layers using 2-0 chromic in  a running-locked stitch and then a 3-0 Vicryl running.  Exam under  anesthesia at that time revealed the mass had been completely removed.  On exam of the mass, it did not appear to be a Bartholin's cyst  or even  a sebaceous cyst, but we will send to Pathology for further analysis.  The patient tolerated the procedure well.  All sponge, lap and  instrument counts were correct x2.  The patient went to recovery room in  stable condition.      Michelle L. Vincente Poli, M.D.  Electronically Signed     MLG/MEDQ  D:  02/18/2007  T:  02/18/2007  Job:  [id number]

## 2010-11-01 NOTE — H&P (Signed)
[name] [name] [name], [name] [name]               ACCOUNT NO.:  [id number]   MEDICAL RECORD NO.:  [id number]          PATIENT TYPE:  EMS   LOCATION:  MAJO                         FACILITY:  [facility]   PHYSICIAN:  Lonia Blood, M.D.       DATE OF BIRTH:  09-May-1971   DATE OF ADMISSION:  07/11/2005  DATE OF DISCHARGE:                                HISTORY & PHYSICAL   PRIMARY CARE PHYSICIAN:  Dr. Doristine Counter   CHIEF COMPLAINT:  Rash.   HISTORY OF PRESENT ILLNESS:  40 year old woman with extensive allergy  history who comes into [facility] Emergency Room after she developed a rash  on her body.  About a week ago she went to see Dr. Gerilyn Pilgrim for sinusitis and  she received a prescription for fluoroquinolone antibiotic, prednisone, and  calcium.  She was taking them for about a week until last night when she  developed a rash on her body.  She came into the emergency room today to get  help.  In the emergency room she received Pepcid, Benadryl, and intravenous  dose of Solu-Medrol.  While she was receiving the Solu-Medrol she developed  significant shortness of breath and strider and feeling that someone was  strangling her.  The Solu-Medrol infusion was stopped and she improved  somehow breathing-wise.  By the time I am examining her she does not  complain of any shortness of breath or feeling that she cannot breathe.   PAST MEDICAL HISTORY:  1.  Extensive allergies.  2.  Sinusitis status post sinus surgery.   HOME MEDICATIONS:  1.  Factive.  2.  Prednisone pack.  3.  Calcium 1000 twice a day.  4.  Mevacor.   FAMILY HISTORY:  Father has allergy to shellfish.  Mother has allergy to  soy, penicillin and she had life-threatening allergic reaction to sulfur  medications.   SOCIAL HISTORY:  She does not smoke cigarettes.  Does not drink alcohol.  She works as an Scientist, water quality.   ALLERGIES:  Patient is allergic to AMPICILLIN, ERYTHROMYCIN, MORPHINE.   REVIEW OF SYSTEMS:  Positive for watery eyes and she  reports that her organs  feel like they are on fire.   PHYSICAL EXAMINATION:  VITAL SIGNS:  Temperature 99.1, pulse 79,  respirations 18, blood pressure 142/93, saturation 99% on room air.  GENERAL:  She is a well-developed, well-nourished woman in no acute  distress.  Alert and oriented to place, person, and time.  HEENT:  Head is normocephalic, atraumatic.  Eyes have pupils are equal,  round, and reactive to light and accommodation.  Extraocular movements  intact.  Her face is covered in a macular rash.  Her throat is clear.  Her  tongue has some slight edema but she is able to protrude the tongue out of  the mouth.  The uvula is in the midline and does not appear to have edema.  NECK:  Supple without JVD.  There is no strider and there is no pseudo  wheezing.  CHEST:  Clear to auscultation bilaterally without wheezes, rhonchi, or  crackles.  HEART:  Regular rate  and rhythm without murmurs, rubs, or gallops.  ABDOMEN:  Soft, nontender.  Bowel sounds are present.  EXTREMITIES:  No edema.  SKIN:  Widespread rash over the face, arms, and upper part of the body.  NEUROLOGIC:  Nonfocal.  PSYCHIATRIC:  Patient appears to have intact memory, mood.   ASSESSMENT/PLAN:  Allergic reaction.  I do suspect this is secondary to  fluoroquinolone.  I cannot rule out steroid-induced allergic reaction,  although I highly doubt it.  I have consulted by phone with Dr. Lucie Leather who  recommended oral Pepcid, Zyrtec, Singulair, and an Epipen on-hand.  Also, I  have consulted with ear, nose, and throat for the strider.  Plan is to  observe Ms. Harr for a few hours in the emergency room and if her  breathing stays well then we are going to plan for a discharge home with an  Epipen.      Lonia Blood, M.D.  Electronically Signed     SL/MEDQ  D:  07/11/2005  T:  07/11/2005  Job:  [id number]   cc:   Marjory Lies, M.D.  Fax: [phone]

## 2010-11-01 NOTE — Consult Note (Signed)
[name] [name], [name] [name]               ACCOUNT NO.:  [id number]   MEDICAL RECORD NO.:  [id number]          PATIENT TYPE:  EMS   LOCATION:  MAJO                         FACILITY:  [facility]   PHYSICIAN:  Antony Contras, MD     DATE OF BIRTH:  [birth year]   DATE OF CONSULTATION:  07/11/2005  DATE OF DISCHARGE:                                   CONSULTATION   CHIEF COMPLAINT:  Medication allergic reaction and stridor.   HISTORY OF PRESENT ILLNESS:  The patient is a 40 year old white female and a  patient of Dr. Gerilyn [name], who has a long history of sinus disease treated with  multiple rounds of antibiotics most recently consisting of Factive for 10  days and a steroid Prednisone dose pack.  She started that medication on  January 17 and has been on it for nine days.  At about 7:15 last night, she  developed a rash over her body with mild itching.  She took Benadryl last  night and was able to sleep.  This morning, she noticed some swelling of her  tongue, and slight change to her speech and came to the emergency  department.  In the emergency department, she was given Benadryl and Pepcid  and was feeling better, but was also given a half dose of Solu-Medrol  intravenously.  During administration, she suddenly felt short of breath and  had stridor.  The medication was stopped and she was given oxygen and  epinephrine, and quickly her airway symptoms cleared up.  Currently, she has  no trouble breathing, but does feel a little bit of lung discomfort.  She is  back to her baseline.  She still has postnasal drainage that is not clear,  but it is a lot better than it was.  She is scheduled for follow up with Dr.  Gerilyn [name] on February 1.  She is evaluated in the emergency department.   PAST SURGICAL HISTORY:  1.  Giant cell tumor extraction.  2.  Sinus surgery x2.   MEDICATIONS:  1.  Nasacort.  2.  A steroid Prednisone dose pack.  3.  Factive.   ALLERGIES:  1.  AMPICILLIN.  2.  ERYTHROMYCIN.  3.   MORPHINE.   SOCIAL HISTORY:  The patient denies alcohol use or smoking and has not had  any weight change.   FAMILY HISTORY:  Positive for allergies, cancer, high blood pressure, lung  disease, stroke and diabetes.   REVIEW OF SYSTEMS:  Negative except as per HPI.   PHYSICAL EXAMINATION:  VITAL SIGNS:  Temperature 99.1, blood pressure  143/93, pulse 79, respirations 18.  GENERAL:  The patient is in no acute distress, and was pleasant and  cooperative.  Voice and enunciation sounds normal.  SKIN:  The face, neck, torso, abdomen and proximal limbs are covered in a  bright red maculopapular rash.  EARS:  Tympanic membranes and external auditory canals are normal.  NOSE:  The nasal passages are clear with no evidence of polyps on anterior  exam.  ORAL CAVITY AND OROPHARYNX EXAM:  The tongue does not appear to edematous  and the flora of the mouth is normal in appearance.  There is no edema noted  in the oropharynx, or anywhere in the oral cavity or oropharynx.  There are  no other lesions noted.  NECK:  Neck is soft with no lymphadenopathy or mass.   LABORATORY:  White blood count 22 with 97% neutrophils (this was performed  after administration of steroids).   ASSESSMENT:  The patient is a 40 year old white female with a long history  of sinus disease with an apparent allergic reaction to a medication, likely  Factive.  The patient has a diffuse maculopapular rash as a result and is in  the emergency department due to some tongue swelling.  While in the  emergency department, she had an acute airway reaction to the administration  of Solu-Medrol.   PLAN:  I was consulted to evaluate her airway with the possibility of  letting her go home from the hospital because of too many patients and  because there was no rooms available in the hospital.  However, I do not  feel it is safe for her to go home regardless of what her airway looks like.  For evaluation of the airway, a fiberoptic  laryngoscopy will be performed.  However, my recommendation as passed along to the medical team that has seen  her already is that she be observed overnight to ensure that there is no  additional airway complications.  Clearly, she should stop Factive and we  discussed whether she should be on another antibiotic at present.  My  feeling is that she should allow her allergic reaction to resolve before  trying an additional antibiotic.  She should then keep follow up with Dr.  Gerilyn [name].      Antony Contras, MD  Electronically Signed     DDB/MEDQ  D:  07/11/2005  T:  07/12/2005  Job:  [id number]   cc:   Antony Contras, MD  Fax: [phone]

## 2010-11-01 NOTE — Discharge Summary (Signed)
[name], [name]               ACCOUNT NO.:  [id number]   MEDICAL RECORD NO.:  [id number]          PATIENT TYPE:  INP   LOCATION:  5501                         FACILITY:  [facility]   PHYSICIAN:  Isidor Holts, M.D.  DATE OF BIRTH:  Mar 05, 1971   DATE OF ADMISSION:  08/11/2005  DATE OF DISCHARGE:  08/21/2005                                 DISCHARGE SUMMARY   PMD:  [name] [name].   ENT:  [name], MD   DISCHARGE DIAGNOSES:  1.  Community-acquired pneumonia.  2.  Acute-on-chronic sinusitis.  3.  Steroid-induced hyperglycemia.  4.  Remote history of exercise-induced asthma.  5.  Multiple medication allergies.   DISCHARGE MEDICATIONS:  1.  Os-Cal with vitamin D 500 mg p.o. b.i.d.  2.  Allegra-D 180 mg p.o. daily.  3.  Nexium 40 mg p.o. b.i.d.  4.  Singulair 10 mg p.o. daily.  5.  Ambien CR 12.5 mg p.o. q.h.s.  6.  Prednisone 10 mg p.o. daily.  7.  Doxycycline 100 mg p.o. b.i.d. x5 days only.  8.  Asmanex Twisthaler 220 mcg 2 puffs q.h.s.  9.  Zyrtec 10 mg p.o. daily.   PROCEDURES:  1.  Chest x-ray dated August 11, 2005:  This showed bilateral lower lung      air space disease, likely representing pneumonia or aspiration.  There      was also mild vascular congestion.  2.  Two view chest x-ray dated August 14, 2005:  This showed no change in      right lower lobe air space disease.  There was resolution of the left      lung base air space disease.  3.  Chest CT scan dated August 14, 2005:  This showed nonspecific lower lung      ground glass opacities which may represent infection, inflammatory      changes, and/or atelectasis.  There was a tiny right pleural effusion.  4.  Facial maxillary CT scan dated August 14, 2005:  This showed acute-on-      chronic paranasal sinusitis.  5.  Chest x-ray, two view, dated August 19, 2005:  This showed interval      improvement in bibasilar infiltrates.  No significant residue or      infiltrate available by plain film.  6.   Facial maxillary CT scan dated August 19, 2005:  This showed postsurgical      change in the maxillary sinuses with bilateral maxillary sinus, left      sphenoid sinus, and ethmoid air cell disease.   CONSULTATIONS:  1.  Marcelyn Bruins, M.D., pulmonologist.  2.  [name], M.D., ENT.  3.  [name], M.D., allergy/immunology.   ADMISSION HISTORY:  As in H&P notes of August 11, 2005, dictated by Dr.  Threasa Beards. However, in brief, this is a 40 year old female, with known  history of chronic sinusitis, requiring surgery on at least two occasions.  Also, remote history of exercise-induced asthma.  In the past she has had  recurrent bouts of pneumonia, and is allergic to multiple medications,  including antibiotics.  The patient  has been under the care of Dr. [name] of ENT for her sinus problems and had in January, 2007 been commenced  on prednisone treatment, which she was still taking at the time of this  presentation.  She presents on this occasion with cough and chills, was  found to have pyrexia of 102.1 and was admitted for further evaluation,  investigation, and management.   CLINICAL COURSE:  1.  Community-acquired pneumonia:  The patient presents with pyrexia,      chills, and coughing.  Chest x-ray demonstrated bibasilar air space      disease.  She was therefore commenced on a combination of ceftriaxone      and azithromycin as well as bronchodilator nebulizers and oxygen      supplementation.  On August 13, 2005, the patient complained of      tingling of the lips, numbness of tongue.  She does have a history of      intolerance/allergy to QUINOLONES, PENICILLIN, ERYTHROMYCIN, MORPHINE,      SOLU-MEDROL, BENADRYL, and ATARAX.  Antibiotics were discontinued.  The      azithromycin only, was restarted on August 13, 2005.  By August 15, 2005, it was apparent that clinical response was not forthcoming.  The      patient was therefore switched to Primaxin.  Pulmonary  consultation was      called, which was kindly provided by Dr. Marcelyn Bruins, who recommended      continuing present antibiotic regimen.  Also recommended a steroid      taper.  Allergy/immunology was consulted.  Consultation was kindly      provided by Dr. [name], who also concurred with the impression of a      bilateral pneumonia but recommended CT scan of the sinus and chest for      additional evaluation and a vasculitis screen, including C-ANCA, P-ANCA,      and immune workup, including quantitative IgG, IgA, IgM, and IgE.  The      dilemma of course, is that as patient was on systemic steroids, any      results obtained at this time would be suspect only, especially if they      were negative, so it was recommended that these tests should be done      following weaning off steroids.  This should be about  three weeks after      patient is off steroids.  Be that as it may, the patient responded to      Primaxin and this was confirmed by a repeat chest x-ray of August 19, 2005, which showed complete resolution of pulmonary infiltrate.  The      patient was switched to oral doxycycline on August 20, 2005.  She is      expected to complete a further five-day course of this.   1.  Acute-on-chronic sinusitis:  The patient has a history of long-standing      sinus problems, and has in the past had surgical intervention.  Her ENT      surgeon is Dr. [name].  A maxillofacial CT scan dated August 19, 2005      showed postsurgical change in the maxillary sinuses and also bilateral      maxillary sinus, left sphenoid sinus and ethmoid air cell disease.  Dr.      [name] has been consulted.  She has reviewed the patient, and  her      conclusion/recommendation is that patient will require clearing out of      her of left sphenoid sinus and ethmoid air cells, however, this can be     carried out on an outpatient basis.  She has undertaken to contact      patient following discharge, to  arrange surgery.   1.  Chronic steroid treatment:  The patient was started on prednisone, I      believe, on June 22, 2005.  She at the time of presentation, was on 30      mg of prednisone a day.  Per pulmonology, rapid taper is indicated and      we are in the process of weaning her off.  As of August 20, 2005, she was      on 7.5 mg of prednisone b.i.d.  This has been changed to 10 mg p.o.      daily from August 21, 2005, and we have recommended that she continue      this, until seen by Dr. [name].  PMD is likely to play a leading      part in weaning her off this medication.   1.  Steroid-induced hyperglycemia:  This was managed with sliding-scale      insulin, during the course of the patient's hospital stay.  Certainly,      as steroids were gradually tapered.  Her CBG has normalized.   1.  History of exercise-induced asthma:  Per recommendations of Dr. Shelle Iron,      pulmonologist, the patient is to be continued on inhaled corticosteroids      as an outpatient.  Her Singulair was discontinued during the course of      her hospital stay.  Whether or not this should be recommenced, will be      deferred to the patient's primary MD.   DISPOSITION:  It is anticipated that the patient will be discharged on August 21, 2005, provided no new issues arise.  She is recommended to return to  regular duties on August 25, 2005.   DIET:  No restrictions.   ACTIVITY:  As tolerated.   WOUND CARE:  Not applicable.   PAIN MANAGEMENT:  Not applicable.   FOLLOW-UP INSTRUCTIONS:  Patient is instructed to follow up with her primary  MD at [facility] routinely, per prior scheduled  appointment.  She is to call for an appointment.  Per Dr. Judye Bos  service, patient will be contacted by Dr. [name] to arrange sinus  surgery.   SPECIAL INSTRUCTIONS:  Primary MD is recommended to arrange outpatient  pulmonary function testing and also to do vasculitic screening, including  P-  ANCA, C-ANCA.  An immunology profile with IgG, IgM, IgA, and IgE in  approximately 2-3 weeks following discontinuation of steroids.      Isidor Holts, M.D.  Electronically Signed     CO/MEDQ  D:  08/20/2005  T:  08/20/2005  Job:  [id number]   cc:   Marcelyn Bruins, M.D. [facility]  [address]  [place]  [place] [address]   [name], M.D.   [name], MD  Fax: [phone]

## 2010-11-01 NOTE — Op Note (Signed)
[name] [name], SCHLOTTMAN               ACCOUNT NO.:  [id number]   MEDICAL RECORD NO.:  [id number]          PATIENT TYPE:  EMS   LOCATION:  MAJO                         FACILITY:  [facility]   PHYSICIAN:  Antony Contras, MD     DATE OF BIRTH:  1971-05-01   DATE OF PROCEDURE:  07/11/2005  DATE OF DISCHARGE:                                 OPERATIVE REPORT   PREOPERATIVE DIAGNOSIS:  Acute airway reaction to medication with stridor.   POSTOPERATIVE DIAGNOSIS:  Acute airway reaction to medication with stridor.   OPERATION PERFORMED:  Transnasal fiberoptic laryngoscopy.   SURGEON:  Antony Contras, MD   ANESTHESIA:  Topical.   COMPLICATIONS:  None.   INDICATIONS FOR PROCEDURE:  The patient is a 40 year old white female is in  the emergency department due to a maculopapular rash that is felt to be an  allergic reaction to medication.  While in the emergency department she was  given Solu-Medrol and during IV administration, she developed acute stridor  and shortness of breath.  This responded to oxygen and epinephrine treatment  but airway evaluation was requested.   FINDINGS:  There was no evidence of edema or other lesion in the nasal  passages, nasopharynx, or pharynx or larynx.  Nasal polyps were not seen in  either nasal passage and there was absolutely no evidence of edema in the  upper airway or digestive tract.   DESCRIPTION OF PROCEDURE:  The patient was identified in the emergency  department and after informed consent was obtained, the right nasal passage  was dripped with 1% lidocaine.  Fiberoptic laryngoscope was then inserted  into the left and then right nasal passages to evaluate for nasal polyps.  Then the laryngoscope was passed down the right nasal passage through the  nasopharynx and down into the pharynx to view the pharynx and larynx.  Findings are noted above.  After this, the laryngoscope was removed and the  patient was returned to emergency room care without  complication.      Antony Contras, MD  Electronically Signed     DDB/MEDQ  D:  07/11/2005  T:  07/12/2005  Job:  [id number]

## 2010-11-01 NOTE — Op Note (Signed)
[name] [name], [name] [name]               ACCOUNT NO.:  [id number]   MEDICAL RECORD NO.:  [id number]          PATIENT TYPE:  AMB   LOCATION:  SDS                          FACILITY:  [facility]   PHYSICIAN:  [name], MD         DATE OF BIRTH:  1970/07/11   DATE OF PROCEDURE:  09/25/2005  DATE OF DISCHARGE:                                 OPERATIVE REPORT   PREOPERATIVE DIAGNOSIS:  Chronic pansinusitis.   POSTOPERATIVE DIAGNOSIS:  Chronic pansinusitis.   PROCEDURE:  Revision right total ethmoidectomy with right frontal recess  exploration, left frontal recess exploration, bilateral sphenoidotomies,  Stealth image guidance.   SURGEON:  Dr. [name].   ANESTHESIA:  General endotracheal anesthesia.   ESTIMATED BLOOD LOSS:  30 mL.   SPECIMENS:  Bilateral sinus contents.   COMPLICATIONS:  None.   INDICATIONS:  The patient is a 40 year old female who has previously  undergone bilateral maxillary antrostomies and ethmoidectomy as well as  septoplasty and bilateral inferior turbinate reductions.  Despite sinus  surgery, she continues to have a chronic sinus disease with recurrent  infection and mucosal thickening which has required prolonged antibiotic  therapy.  The patient has frequent nasal congestion as well.  Sinusitis has  been documented on CT scan.  She has been on prolonged steroid therapy in  part due to an allergic reaction to [facility].  Due to the ongoing infection,  the patient is taken to the operating room for procedure.   FINDINGS:  The patient was noted to have very minimal mucosal disease and no  evidence of infection.  There were some residual superior ethmoid cells on  the right, which had to be taken down.  The left ethmoid cavity was in good  condition and there was no revision necessary.  Both of the maxillary  antrotomy sites were widely patent as previously created.   PROCEDURE:  The patient was taken to the operating room and placed on the  table in the supine  position.  She was then placed under general  endotracheal anesthesia and the table rotated clockwise 90 degrees.  The  Stealth image guided system was applied to the forehead and the system  calibrated.  Each of the nasal cavities was decongested with Afrin on  cottonoid pledgets.  The patient was then draped in the usual fashion.  The  left side was performed first.   Lidocaine 1% with 1:100,000 of epinephrine was used to inject the left  lateral nasal wall in the area of the middle turbinate.  The middle  turbinate was also injected as was the anterior portion of the inlet to the  ethmoid cavity.  At this point, Stealth image guided system was used to  identify the landmarks of the previous surgery.  The sphenoidotomy was  performed through the ethmoid cavity.  The microdebrider was used to open up  the sphenoid sinus widely.  Once this was performed, attention was turned to  the frontal recess region.  Agger nasi cell was taken down and there was  some residual bony debris in the frontal recess  area.  The frontal sinus is  very small.  There was not a wide opening created, but rather the superior  ethmoid cells were taken down in this area to allow opening into the frontal  recess.  Attention was then turned to the right nasal cavity.  The injection  was performed in an identical fashion.  There were no adhesions between the  middle turbinate and lateral nasal wall as there were on the left.  This had  to be taken down on the left side.  On the right, the maxillary antrostomy  site was widely patent.  There were ethmoid air cells that had to be taken  down superiorly.  The amount of removal was done not significant, however,  ethmoid air cells had to be taken down laterally posterior to the maxillary  antrotomy site and posterior superiorly in very small amount.  Anterior  superiorly, there were a few more ethmoid small cells that had to be taken  down.  This allowed entry into the  frontal recess region with a very small  sinus cavity noted.  The 45 degree curved microdebrider was used for this  portion of the procedure as was the 45 degree endoscope.  The 0 degree  endoscope was used for the remainder of the procedure.  The sphenoidotomy  was created through the posterior ethmoid sinus wall and opened  significantly.  The sinus was without any evidence of infection or mucosal  swelling.  At this point, the sinus surgery was performed.  Rolled Gelfilm  was placed in each of the ethmoid cavities and Bactroban ointment injected  into both of the ethmoid cavities.  Nasal cavities were suctioned free of  blood as was the oral cavity.  The table was rotated counterclockwise 90  degrees to its original position.  The patient was awakened from anesthesia  and taken to the post anesthesia care unit in stable condition.  There were  no complications.      [name], MD  Electronically Signed     SJ/MEDQ  D:  09/25/2005  T:  09/25/2005  Job:  [id number]   cc:   Marjory Lies, M.D.  Fax: [phone]   Kerry Kass, M.D. [facility]  [address]  [place]  [place] [address]

## 2010-11-01 NOTE — Discharge Summary (Signed)
[name] [name], [name] [name]               ACCOUNT NO.:  [id number]   MEDICAL RECORD NO.:  [id number]          PATIENT TYPE:  INP   LOCATION:  2904                         FACILITY:  [facility]   PHYSICIAN:  Lonia Blood, M.D.      DATE OF BIRTH:  [birth year]   DATE OF ADMISSION:  07/11/2005  DATE OF DISCHARGE:                                 DISCHARGE SUMMARY   PRIMARY CARE PHYSICIAN:  Dr. Marjory Lies, [facility].   ENT:  Dr. Gerilyn Pilgrim.   DISCHARGE DIAGNOSES:  1.  Severe allergic reaction to Factive antibiotic, Benadryl and Solu-Medrol      intravenously with resultant transient angioedema.  2.  Leukocytosis.  3.  Rashes and severe itching secondary to allergic reaction.   DISCHARGE MEDICATIONS:  1.  Pepcid 20 mg p.o. twice daily.  2.  Prednisone taper starting at 60 mg p.o. twice daily for 3 days and      tapered over 18 days.  3.  Os-Cal +D 500 mg p.o. twice daily.  4.  Nasacort two puffs twice daily.   DISPOSITION:  The patient currently feels much better.  She is able to  swallow and eat without difficulties.  She will be followed by Dr. Gerilyn Pilgrim 4  days after discharge.  She will also follow with Dr. Doristine Counter, et. al.,  within the next 1-2 weeks.  She has been also advised to follow up with a  local allergist for further workup.   PROCEDURES PERFORMED:  Chest x-ray on July 11, 2005 showed no acute  cardiopulmonary disease.   CONSULTATIONS:  Social visit by Dr. Gerilyn Pilgrim, her ENT surgeon.   BRIEF HISTORY AND PHYSICAL:  Please refer to dictated history and physical  by Dr. Lonia Blood.  In short, however, the patient is a 40 year old female.  She has an extensive history of allergies.  She was started on Factive, a  new fluoroquinolone, antibiotic together with some prednisone and calcium  due to some sinusitis.  The patient had been taking Factive when all of a  sudden she started tapering the steroids down and started having allergic  reaction.  While in the ED, she was  seen and there was question of possible  angioedema as well.  She was having problem with swallowing as well as  extensive rash throughout all the body that blanched with pressure.  It was  quite clear with the rash and the itching, that the patient was experiencing  some allergic reaction.  She subsequently received IV Solu-Medrol in the ED  and her rashes and reaction worsened; hence, she was admitted for further  followup and observation.   HOSPITAL COURSE:  PROBLEM #1 - SEVERE ALLERGIC REACTION:  The patient seems  to be allergic to multiple medications.  She was initially put on IV  Benadryl and Pepcid and switched from IV Solu-Medrol due to prednisone due  to her initial reaction.  The patient continued to be allergic to IV  Benadryl and some reaction even on  the oral Benadryl.  She subsequently was taken off the Benadryl.  The dose  of prednisone was  raised to a higher dose of 60 mg p.o. q.6 h. but will  taper it through twice daily and down the way over an 18- to 20-day period.  Further followup will be by her primary care physician.      Lonia Blood, M.D.  Electronically Signed     LG/MEDQ  D:  07/13/2005  T:  07/14/2005  Job:  [id number]   cc:   Marjory Lies, M.D.  Fax: [phone]

## 2010-11-01 NOTE — Discharge Summary (Signed)
NAMEMAVERY, [name] [name]               ACCOUNT NO.:  [id number]   MEDICAL RECORD NO.:  [id number]          PATIENT TYPE:  INP   LOCATION:  2904                         FACILITY:  [facility]   PHYSICIAN:  Jacquelynn Cree, M.D.   DATE OF BIRTH:  1970-11-12   DATE OF ADMISSION:  07/11/2005  DATE OF DISCHARGE:  07/15/2005                                 DISCHARGE SUMMARY   PRIMARY CARE PHYSICIAN:  Dr. Juanita Craver with [facility].   EAR, NOSE AND THROAT PHYSICIAN:  Silvestre Moment, M.D.   DERMATOLOGIST:  Hope M. Tonia Brooms, M.D.   ALLERGIST:  Glory Buff, M.D.   DISCHARGE DIAGNOSES:  1.  Severe allergic reaction to __________  antibiotic with angioedema and      generalized pleuritic rash.  2.  Leukocytosis.  3.  Recent sinusitis.   DISCHARGE MEDICATIONS:  1.  Pepcid 20 mg b.i.d.  2.  Prednisone taper starting at 60 mg b.i.d. x3 days and tapering by 10 mg      every three days.  3.  Os-Cal plus D 500 mg b.i.d.  4.  Nasacort two puffs b.i.d.  5.  Claritin 10 mg daily.  6.  Ambien CR 12.5 mg q.h.s. p.r.n. sleep.  7.  EpiPen:  The patient instructed to keep this with her at all times.  8.  Singular 10 mg daily.   CONSULTATIONS:  1.  Dr. Edison Nasuti at [facility].  2.  Dr. Tonia Brooms of Dermatology.   HISTORY OF PRESENT ILLNESS:  The patient is a 40 year old female with past  medical history of multiple allergies and a family history of severe  allergic reactions who presented to the emergency department due to an  episode of angioedema with pleuritic rash.  She recently had been started on  a course of __________  antibiotic for treatment of an active case of  sinusitis.  She was given Solu-Medrol and Benadryl in the emergency  department and her symptoms got worse.  She refused any further treatment  with Benadryl and Solu-Medrol due to the temporal relationship of her  worsening symptoms with the administration of these medications.  She was  admitted for further evaluation and  observation.   PROCEDURE AND DIAGNOSTIC STUDIES:  Chest x-ray on July 11, 2005 showed no  acute cardiopulmonary disease.   HOSPITAL COURSE:  Problem 1. Severe allergic reaction with intermittent  angioedema and generalized pruritic rash.  The patient was initially put on  IV Benadryl and Solu-Medrol as well as Pepcid.  She refused any further Solu-  Medrol or Benadryl secondary to transient worsening of her symptoms after  administration of these medications.  The Benadryl and Solu-Medrol were  subsequently stopped and she was started on prednisone 60 mg q.6h. which was  tapered to b.i.d. by the time of her discharge.  She was continued on Pepcid  and put on Claritin as well as Singulair.  The patient was understandably  quite anxious with regard to her symptoms and what may have triggered them.  Given this, her dermatologist was consulted to provide some reassurance to  the patient.  She was  seen by Dr. Tonia Brooms on July 14, 2005 who recommended  a skin biopsy if the current rash continues to persist as the prednisone is  tapered off.  She did not have any other recommendations and did not feel  the patient had any evidence of vasculitis, erythema multiforme, toxic  epidermal necrolysis or Stevens-Johnson Syndrome.  The patient also had  several studies done to look for a possible link to hereditary angioedema.  Her complement studies have not been revealing.  At the time of this  dictation, the C2 level is pending as well as the C1 esterase and function  which Dr. Donneta Romberg of allergy and immunology suggested we checked for  completeness.  A telephone consult was made with Dr. Donneta Romberg who did not feel  that the patient needed to be seen acutely but recommended close follow-up  and a appointment was scheduled for her to be seen by her regular allergist,  Dr. Velora Heckler on July 22, 2005 at 2:15 p.m.  She also has a follow-up  allergy appointment on Thursday.  She plans to keep both of  those  appointments.  Additionally, other testing did not reveal any systemic  problems.  Her ESR was two.  Her rheumatoid factor was less than 20.  Her  anti nuclear antibody was negative.  She will follow up with Dr. Velora Heckler as  scheduled for further evaluation and ongoing treatment for her severe  allergy.  She was advised to carry her EpiPen with her at all times and to  use it if she had any sensation of swelling of the tongue or throat.  She  will see someone from the allergy and immunology department in two days and  I recommended she be evaluated by them before she starts to taper her  prednisone.  Problem 2. Sinusitis.  The patient recently had been treated for sinusitis  with __________ .  Dr. Ardis Hughs did see her in the hospital and recommended a  follow-up CT scan next week.  She agreed with keeping the patient off the  antibiotics at this point.  Problem 3. Leukocytosis.  The patient had leukocytosis which was felt to be  secondary to the demargination effect of steroids.  She should have a follow-  up CBC done by her primary care physician to ensure resolution once at  steroids were tapered off.   DISPOSITION:  The patient is discharged home.  She will follow up with Dr.  Ardis Hughs, Dr. Velora Heckler and her primary care physician.  She is instructed to  call 911 or return to the emergency department if she has any recurrent  severe allergic reactions.           ______________________________  Jacquelynn Cree, M.D.     CR/MEDQ  D:  07/15/2005  T:  07/15/2005  Job:  [id number]   cc:   Glory Buff, M.D. [facility]  [address]  [place]  [place] [address]   Juanita Craver, M.D.  Fax: [phone]   Silvestre Moment, MD  Fax: [place] M. Tonia Brooms, M.D.  Fax: [phone]

## 2010-11-01 NOTE — H&P (Signed)
NAMERALPHINE, HINKS               ACCOUNT NO.:  [id number]   MEDICAL RECORD NO.:  [id number]          PATIENT TYPE:  INP   LOCATION:  5501                         FACILITY:  [facility]   PHYSICIAN:  Nelma Rothman, MD   DATE OF BIRTH:  May 10, 1971   DATE OF ADMISSION:  08/11/2005  DATE OF DISCHARGE:                                HISTORY & PHYSICAL   PRIMARY CARE PHYSICIAN:  [name]   CHIEF COMPLAINT:  Cough and chills.   HISTORY OF PRESENT ILLNESS:  The patient is a 40 year old female complaining  of 3 days of cough which started on Friday, at which time it was mild and  nonproductive. However, today it got much worse, productive of yellow sputum  now. She also complains of shortness of breath and dyspnea. She had cold  chills at home but no fever of which she was aware until arrival in the  emergency department when her temperature was 102.1. She has been on  prednisone since June 22, 2005, for chronic sinusitis follwoing a severe  allergic reaction to fluoroquinolone.   ALLERGIES:  Anaphylactic reaction to FLUOROQUINOLONE.   MEDICATIONS:  1.  Prednisone as part of a taper. She is currently taking 20 mg q.a.m. and      10 mg q.p.m.  2.  Allegra D 180 mg daily.  3.  Zyrtec 10 mg daily.  4.  Nexium 40 mg b.i.d.  5.  Singulair 10 mg daily.  6.  Ambien CR 12.5 mg q.h.s.  7.  Calcium/vitamin D tablets 500 mg b.i.d.   PAST MEDICAL HISTORY:  1.  Chronic sinusitis.  2.  Recent anaphylactic reaction.   FAMILY HISTORY:  Her father has diabetes. Her mother, maternal uncle, and  grandmother all have hypertension. She has a sister who had thyroid cancer.   SOCIAL HISTORY:  She has a 40-year-old daughter. She denies any tobacco or  alcohol use.   PHYSICAL EXAMINATION:  VITAL SIGNS:  Temperature 102.1, pulse 120, blood  pressure 125/55, respiration rate 24, oxygen saturation 100% on 2 L.  GENERAL:  She is anxious and slightly dyspneic.  HEENT:  Mucous membranes  are moist. There was no evidence of thrush.  NECK:  Supple. There is no lymphadenopathy.  HEART:  Slightly tachycardic but no murmurs, rubs, or gallops.  LUNGS:  There are scattered rhonchi with mild wheezing throughout.  ABDOMEN:  Soft, nontender, nondistended with normoactive bowel sounds.  EXTREMITIES:  There is no edema.   LABORATORY DATA:  White blood cell count 13.8, hemoglobin 13.7, hematocrit  40.3, and platelet count 208. Sodium 135, potassium 2.9, chloride 102,  bicarb 25, BUN 10, creatinine 0.8, and glucose 176. Chest x-ray demonstrates  bibasilar airspace disease likely consistent with pneumonia or aspiration as  well as mild vascular congestion.   ASSESSMENT AND PLAN:  1.  Pneumonia which is community-acquired pneumonia complicated by      immunosuppression on prednisone. We will start ceftriaxone and      azithromycin as well as albuterol nebulizing treatments. If she is not      improved over the next 24-48  hours would need to consider opportunistic      infections such as PCP, given 2 months of high-dose prednisone use. She      would likely need bronchoscopy for BAL for diagnosis in this case. Will      check an LDH with a.m. labs.  2.  Chronic sinusitis. Will continue home medications including prednisone      taper.  3.  Hypokalemia of unclear etiology. Will replete and recheck in the a.m.  4.  Hyperglycemia, which is likely steroid induces. She has no known history      of diabetes. We will check a hemoglobin A1c and continue sliding scale      insulin while she is here in the hospital.      Nelma Rothman, MD  Electronically Signed     RAR/MEDQ  D:  08/12/2005  T:  08/12/2005  Job:  [id number]

## 2010-11-01 NOTE — Consult Note (Signed)
[name] [name], [name] [name]               ACCOUNT NO.:  [id number]   MEDICAL RECORD NO.:  [id number]          PATIENT TYPE:  INP   LOCATION:  5501                         FACILITY:  [facility]   PHYSICIAN:  Marcelyn Bruins, M.D. [facility] DATE OF BIRTH:  [birth year]   DATE OF CONSULTATION:  08/15/2005  DATE OF DISCHARGE:                                   CONSULTATION   HISTORY OF PRESENT ILLNESS:  The patient is a very complicated 40 year old  white female who I have been asked to see for an abnormal chest x-ray/CT  scan.  The patient has a history of exercise-induced asthma in the distant  past but has not required any treatment for asthma in quite some time.  She  has had difficulties over the years with chronic sinusitis requiring surgery  on at least two occasions.  She has had a few episodes of pneumonia probably  related to her chronic sinusitis.  The patient has been having a lot of  difficulties with very unusual reaction which as of yet has not been  characterized.  She has been to [facility], and the exact etiology  remains unknown.  Most recently, she has had great difficulties with acute  and chronic sinusitis, and Dr. Gerilyn Pilgrim has been trying to treat the patient  conservatively with steroids as well as antibiotics; however, most recently,  she was contemplating surgery.  The patient had absolutely no pulmonary  symptoms at all until this week when she began to develop cough with  purulent mucus that was scant in nature and tinged with blood, increased  chest congestion, worsening dyspnea, as well as fever.  She came to the  emergency room where she was found to have a fever of 101.  She was  subsequently admitted for treatment.  Her chest x-ray at that time showed  right middle and right lower lobe as well as left lower lobe airspace  disease.  The patient has been treated with azithromycin as well as Rocephin  and now is on azithromycin alone.  Her chest x-ray has definitely improved,  although she does have some residual right lower lung zone abnormality.  She  is also clinically better but has not returned to her usual baseline.  She  has had a CT scan of the chest which does show some very faint ground-glass  infiltrates in both lower lobes and more prominently in the middle lobe.  Clearly, this has improved when compared to her radiographic studies on  admission.  The patient has also been evaluated by an allergist in house,  and multiple serologic tests have been sent to consider vasculitis.  However, the patient's sed rate is quite low, although this may be a  manifestation of her high-dose steroids.   PAST MEDICAL HISTORY:  1.  Remote history of exercise-induced asthma.  2.  History of chronic sinusitis.  3.  Recent recurrent allergic reactions of unknown type in etiology.   ALLERGIES:  THE PATIENT IS INTOLERANT/ALLERGIC TO FLUOROQUINOLONES,  AMPICILLIN, ERYTHROMYCIN, MORPHINE, SOLU-MEDROL, BENADRYL, AND ATARAX.   SOCIAL HISTORY:  She is married and has a  71-year-old daughter.  She does not  smoke.   FAMILY HISTORY:  Remarkable for diabetes and hypertension as well as a  sister having thyroid cancer.   REVIEW OF SYSTEMS:  As per history of present illness.  Also, see further  documentation on the chart.   PHYSICAL EXAMINATION:  GENERAL:  She is an overweight white female in no  acute distress.  VITAL SIGNS:  Blood pressure 109/68, pulse 75, respiratory rate 20,  temperature 98.3.  HEENT:  Pupils equal, round, reactive to light and accommodation.  Extraocular muscles are intact.  Nares are patent without discharge.  Oropharynx is clear.  NECK:  Supple, without JVD or lymphadenopathy.  There is no palpable  thyromegaly.  CHEST:  Rhonchorous breath sounds in the right mid-lung zone that was focal  in nature.  CARDIAC:  Regular rate and rhythm with a 2/6 systolic murmur.  ABDOMEN:  Soft, nontender.  Good bowel sounds.  GU:  Not done, not indicated   RECTAL:  Not done, not indicated.  BREASTS:  Not done, not indicated.  EXTREMITIES:  Without significant edema, although there was probably trace  ankle edema present.  Pulses were intact distally.  NEUROLOGIC:  Alert and oriented without motor deficits.   IMPRESSION:  Pulmonary infiltrates of unknown etiology.  It is really  unclear whether these are infectious or inflammatory in nature.  This may  simply be a pneumonia related to her chronic sinus disease or something as  complicated as a sign of pulmonary syndrome related to an autoimmune  process.  She did have fever, purulent sputum, and an elevated white count,  and therefore, I suspect that this is pneumonia.  She has also improved with  antibiotics as well as moderate doses of prednisone.  It will be very  difficult to make a diagnosis of an autoimmune process given the doses of  prednisone that she has been on over time.  Again, because I think she is  clinically and radiographically improved that we may be on the right track  with treatment of her infection.  At this point in time, I would continue  her on antibiotics and try to get her prednisone very quickly to 5 to 10 mg  a day.  We know that she has acute/chronic sinusitis, and the best solution  may be to fix the problem.  That would at least allow [place] to see if we can  get her off of steroids and resolve her pulmonary symptoms.  Certainly,  recurrent sinusitis can cause both pneumonia and reactive airways.  I really  do not think this is an opportunistic pulmonary infection given her response  to the current treatment and the fact that she is improved clinically.   RECOMMENDATIONS:  1.  Would continue current antibiotic regimen.  2.  I would decrease her prednisone to 10 mg b.i.d. and quickly try to get      her to 10 mg a day.  3.  Would add inhaled corticosteroids to her nebulizer in the form of      Pulmicort 0.25 mg q.6h. 4.  I would strongly encourage ENT evaluation  and consider a surgical      approach to her sinus disease so we can put some of these issues to      rest.  5.  I would follow up her various pending serologies; however, I think they      will only be helpful if they are abnormal.  If they are normal,  I think      the results really mean nothing because of her history of high-dose      prednisone use.           ______________________________  Marcelyn Bruins, M.D. [facility]     KC/MEDQ  D:  08/15/2005  T:  08/17/2005  Job:  [id number]   cc:   Kerry Kass, M.D. [facility]  [address]  [place]  [place] [address]   [name], MD  Fax: [phone]   Miachel Roux L. Robb Matar, M.D.

## 2010-12-16 ENCOUNTER — Encounter: Payer: Self-pay | Admitting: Cardiology

## 2011-03-28 LAB — CBC
HCT: 41.3
Hemoglobin: 14.3
MCHC: 34.6
MCV: 85.2
Platelets: 249
RBC: 4.84
RDW: 12.6
WBC: 6.2

## 2011-03-28 LAB — HCG, SERUM, QUALITATIVE: Preg, Serum: NEGATIVE

## 2011-04-08 DIAGNOSIS — J3089 Other allergic rhinitis: Secondary | ICD-10-CM | POA: Insufficient documentation

## 2011-04-08 DIAGNOSIS — J309 Allergic rhinitis, unspecified: Secondary | ICD-10-CM | POA: Insufficient documentation

## 2011-04-21 DIAGNOSIS — M25569 Pain in unspecified knee: Secondary | ICD-10-CM | POA: Insufficient documentation

## 2011-04-21 DIAGNOSIS — D480 Neoplasm of uncertain behavior of bone and articular cartilage: Secondary | ICD-10-CM | POA: Insufficient documentation

## 2011-05-29 ENCOUNTER — Other Ambulatory Visit: Payer: Self-pay | Admitting: Obstetrics and Gynecology

## 2011-05-29 DIAGNOSIS — N63 Unspecified lump in unspecified breast: Secondary | ICD-10-CM

## 2011-06-06 ENCOUNTER — Ambulatory Visit
Admission: RE | Admit: 2011-06-06 | Discharge: 2011-06-06 | Disposition: A | Discharge status: Home / Self Care | Payer: 59 | Source: Ambulatory Visit | Attending: Obstetrics and Gynecology | Admitting: Obstetrics and Gynecology

## 2011-06-06 DIAGNOSIS — N63 Unspecified lump in unspecified breast: Secondary | ICD-10-CM

## 2011-06-20 DIAGNOSIS — J339 Nasal polyp, unspecified: Secondary | ICD-10-CM | POA: Insufficient documentation

## 2011-06-20 DIAGNOSIS — J330 Polyp of nasal cavity: Secondary | ICD-10-CM | POA: Insufficient documentation

## 2011-06-20 DIAGNOSIS — Z883 Allergy status to other anti-infective agents status: Secondary | ICD-10-CM | POA: Insufficient documentation

## 2011-07-24 DIAGNOSIS — R69 Illness, unspecified: Secondary | ICD-10-CM | POA: Insufficient documentation

## 2011-08-04 ENCOUNTER — Ambulatory Visit (HOSPITAL_BASED_OUTPATIENT_CLINIC_OR_DEPARTMENT_OTHER): Payer: 59 | Attending: Allergy and Immunology | Admitting: Radiology

## 2011-08-04 VITALS — Ht 62.0 in | Wt 164.0 lb

## 2011-08-04 DIAGNOSIS — G4710 Hypersomnia, unspecified: Secondary | ICD-10-CM | POA: Insufficient documentation

## 2011-08-04 DIAGNOSIS — R0989 Other specified symptoms and signs involving the circulatory and respiratory systems: Secondary | ICD-10-CM | POA: Insufficient documentation

## 2011-08-04 DIAGNOSIS — R0609 Other forms of dyspnea: Secondary | ICD-10-CM | POA: Insufficient documentation

## 2011-08-04 DIAGNOSIS — G4733 Obstructive sleep apnea (adult) (pediatric): Secondary | ICD-10-CM

## 2011-08-09 DIAGNOSIS — R0989 Other specified symptoms and signs involving the circulatory and respiratory systems: Secondary | ICD-10-CM

## 2011-08-09 DIAGNOSIS — G4710 Hypersomnia, unspecified: Secondary | ICD-10-CM

## 2011-08-09 DIAGNOSIS — G4730 Sleep apnea, unspecified: Secondary | ICD-10-CM

## 2011-08-09 DIAGNOSIS — R0609 Other forms of dyspnea: Secondary | ICD-10-CM

## 2011-08-09 NOTE — Procedures (Signed)
NAMEJAZIYAH, [name] [name]               ACCOUNT NO.:  [id number]  MEDICAL RECORD NO.:  [id number]          PATIENT TYPE:  OUT  LOCATION:  SLEEP CENTER                 [facility]ACILITY[facility]  [facility]  PHYSICIAN:  Rahel Carlton D. Maple Hudson, MD, FCCP, FACPDATE OF BIRTH:  11-Dec-1971  DATE OF STUDY:  08/04/2011                           NOCTURNAL POLYSOMNOGRAM  REFERRING PHYSICIAN:  Jessica Priest, M.D.  REFERRING PHYSICIAN:  Jessica Priest, MD.  INDICATION FOR STUDY:  Hypersomnia with sleep apnea.  EPWORTH SLEEPINESS SCORE:  6/24.  BMI 30.  Weight 164 pounds, height 62 inches, neck 11.5 inch.  MEDICATIONS:  Charted and reviewed.  SLEEP ARCHITECTURE:  Total sleep time 296.5 minutes with sleep efficiency 82.6%.  Stage I was 4.6%, stage II 62.4%, stage III 9.3%. REM 23.8% of total sleep time.  Sleep latency 5 minutes.  REM latency 59 minutes.  Awake after sleep onset 57.5 minutes.  Arousal index 26.7.  BEDTIME MEDICATION: 1. QVAR inhaler. 2. Montelukast.  Sleep was marked by a series of spontaneous waking between 1 and 2:30 a.m., nonspecific.  RESPIRATORY DATA:  Apnea-hypopnea index (AHI) 0.4 per hour.  A total of 2 events were scored, including 1 obstructive apnea and 1 hypopnea. These were nonpositional.  REM AHI 0.9 per hour.  There were insufficient numbers of events to permit application of split protocol, CPAP titration on the study night.  OXYGEN DATA:  Mild-to-moderate snoring with oxygen desaturation to a nadir of 92% and a mean oxygen saturation through the study of 96.3% on room air.  CARDIAC DATA:  Normal sinus rhythm.  MOVEMENT-PARASOMNIA:  No significant movement disturbance.  Bathroom x1.  IMPRESSIONS-RECOMMENDATIONS: 1. Sleep architecture was significant for several spontaneous wakings,     mainly between 1 and 2:30 a.m., unrelated to identified somatic     disturbance.  If this is a pattern in the home environment, then a     trial of treatment as insomnia might be helpful. 2. Rare  respiratory event with sleep disturbance, within normal     limits.  AHI 0.4 per hour with nonpositional events, mild-to-     moderate snoring, and normal oxygenation with a nadir of 92%     saturation and a mean oxygen saturation through the study of 96.3%     on room air.     Laquilla Dault D. Maple Hudson, MD, [facility], FACP Diplomate, American Board of Sleep Medicine    CDY/MEDQ  D:  08/09/2011 10:19:38  T:  08/09/2011 10:30:52  Job:  [id number]

## 2011-10-01 ENCOUNTER — Ambulatory Visit
Admission: RE | Admit: 2011-10-01 | Discharge: 2011-10-01 | Disposition: A | Discharge status: Home / Self Care | Payer: 59 | Source: Ambulatory Visit | Attending: Allergy and Immunology | Admitting: Allergy and Immunology

## 2011-10-01 ENCOUNTER — Other Ambulatory Visit: Payer: Self-pay | Admitting: Allergy and Immunology

## 2011-10-01 DIAGNOSIS — R059 Cough, unspecified: Secondary | ICD-10-CM

## 2011-10-01 DIAGNOSIS — R05 Cough: Secondary | ICD-10-CM

## 2011-10-08 ENCOUNTER — Other Ambulatory Visit: Payer: Self-pay | Admitting: Allergy and Immunology

## 2011-10-08 DIAGNOSIS — R059 Cough, unspecified: Secondary | ICD-10-CM

## 2011-10-08 DIAGNOSIS — I776 Arteritis, unspecified: Secondary | ICD-10-CM

## 2011-10-08 DIAGNOSIS — R05 Cough: Secondary | ICD-10-CM

## 2011-10-09 ENCOUNTER — Ambulatory Visit
Admission: RE | Admit: 2011-10-09 | Discharge: 2011-10-09 | Disposition: A | Discharge status: Home / Self Care | Payer: 59 | Source: Ambulatory Visit | Attending: Allergy and Immunology | Admitting: Allergy and Immunology

## 2011-10-09 DIAGNOSIS — I776 Arteritis, unspecified: Secondary | ICD-10-CM

## 2011-10-09 DIAGNOSIS — R059 Cough, unspecified: Secondary | ICD-10-CM

## 2011-10-09 DIAGNOSIS — R05 Cough: Secondary | ICD-10-CM

## 2011-10-09 MED ORDER — IOHEXOL 300 MG/ML  SOLN
75.0000 mL | Freq: Once | INTRAMUSCULAR | Status: AC | PRN
  Administered 2011-10-09: 75 mL via INTRAVENOUS

## 2011-10-21 ENCOUNTER — Encounter (HOSPITAL_BASED_OUTPATIENT_CLINIC_OR_DEPARTMENT_OTHER): Payer: Self-pay | Admitting: *Deleted

## 2011-10-21 ENCOUNTER — Ambulatory Visit (HOSPITAL_BASED_OUTPATIENT_CLINIC_OR_DEPARTMENT_OTHER)
Admission: EM | Admit: 2011-10-21 | Discharge: 2011-10-22 | Disposition: A | Discharge status: Home / Self Care | Payer: 59 | Attending: General Surgery | Admitting: General Surgery

## 2011-10-21 ENCOUNTER — Emergency Department (INDEPENDENT_AMBULATORY_CARE_PROVIDER_SITE_OTHER): Payer: 59

## 2011-10-21 DIAGNOSIS — R112 Nausea with vomiting, unspecified: Secondary | ICD-10-CM

## 2011-10-21 DIAGNOSIS — N889 Noninflammatory disorder of cervix uteri, unspecified: Secondary | ICD-10-CM

## 2011-10-21 DIAGNOSIS — K3580 Unspecified acute appendicitis: Secondary | ICD-10-CM | POA: Diagnosis present

## 2011-10-21 DIAGNOSIS — J329 Chronic sinusitis, unspecified: Secondary | ICD-10-CM | POA: Insufficient documentation

## 2011-10-21 DIAGNOSIS — R109 Unspecified abdominal pain: Secondary | ICD-10-CM

## 2011-10-21 DIAGNOSIS — K3533 Acute appendicitis with perforation and localized peritonitis, with abscess: Secondary | ICD-10-CM | POA: Insufficient documentation

## 2011-10-21 DIAGNOSIS — K389 Disease of appendix, unspecified: Secondary | ICD-10-CM

## 2011-10-21 LAB — URINALYSIS, ROUTINE W REFLEX MICROSCOPIC
Bilirubin Urine: NEGATIVE
Glucose, UA: NEGATIVE mg/dL
Hgb urine dipstick: NEGATIVE
Ketones, ur: >80 mg/dL — AB
Leukocytes, UA: NEGATIVE
Nitrite: NEGATIVE
Protein, ur: NEGATIVE mg/dL
Specific Gravity, Urine: 1.025 (ref 1.005–1.030)
Urobilinogen, UA: 0.2 mg/dL (ref 0.0–1.0)
pH: 6 (ref 5.0–8.0)

## 2011-10-21 LAB — CBC
HCT: 43.4 % (ref 36.0–46.0)
Hemoglobin: 14.8 g/dL (ref 12.0–15.0)
MCH: 30 pg (ref 26.0–34.0)
MCHC: 34.1 g/dL (ref 30.0–36.0)
MCV: 88 fL (ref 78.0–100.0)
Platelets: 229 10*3/uL (ref 150–400)
RBC: 4.93 MIL/uL (ref 3.87–5.11)
RDW: 12.4 % (ref 11.5–15.5)
WBC: 19.8 10*3/uL — ABNORMAL HIGH (ref 4.0–10.5)

## 2011-10-21 LAB — COMPREHENSIVE METABOLIC PANEL
ALT: 11 U/L (ref 0–35)
AST: 16 U/L (ref 0–37)
Albumin: 4.2 g/dL (ref 3.5–5.2)
Alkaline Phosphatase: 63 U/L (ref 39–117)
BUN: 12 mg/dL (ref 6–23)
CO2: 28 mEq/L (ref 19–32)
Calcium: 9.6 mg/dL (ref 8.4–10.5)
Chloride: 101 mEq/L (ref 96–112)
Creatinine, Ser: 0.8 mg/dL (ref 0.50–1.10)
GFR calc Af Amer: >90 mL/min (ref >90)
GFR calc non Af Amer: >90 mL/min (ref >90)
Glucose, Bld: 101 mg/dL — ABNORMAL HIGH (ref 70–99)
Potassium: 4 mEq/L (ref 3.5–5.1)
Sodium: 137 mEq/L (ref 135–145)
Total Bilirubin: 1.4 mg/dL — ABNORMAL HIGH (ref 0.3–1.2)
Total Protein: 6.9 g/dL (ref 6.0–8.3)

## 2011-10-21 LAB — DIFFERENTIAL
Basophils Absolute: 0 10*3/uL (ref 0.0–0.1)
Basophils Relative: 0 % (ref 0–1)
Eosinophils Absolute: 0 10*3/uL (ref 0.0–0.7)
Eosinophils Relative: 0 % (ref 0–5)
Lymphocytes Relative: 4 % — ABNORMAL LOW (ref 12–46)
Lymphs Abs: 0.8 10*3/uL (ref 0.7–4.0)
Monocytes Absolute: 1.2 10*3/uL — ABNORMAL HIGH (ref 0.1–1.0)
Monocytes Relative: 6 % (ref 3–12)
Neutro Abs: 17.8 10*3/uL — ABNORMAL HIGH (ref 1.7–7.7)
Neutrophils Relative %: 90 % — ABNORMAL HIGH (ref 43–77)

## 2011-10-21 LAB — PREGNANCY, URINE: Preg Test, Ur: NEGATIVE

## 2011-10-21 LAB — LIPASE, BLOOD: Lipase: 27 U/L (ref 11–59)

## 2011-10-21 MED ORDER — SODIUM CHLORIDE 0.9 % IV SOLN
Freq: Once | INTRAVENOUS | Status: AC
  Administered 2011-10-21: 21:00:00 via INTRAVENOUS

## 2011-10-21 MED ORDER — HYDROMORPHONE HCL PF 1 MG/ML IJ SOLN
1.0000 mg | Freq: Once | INTRAMUSCULAR | Status: AC
  Administered 2011-10-21: 1 mg via INTRAVENOUS
  Filled 2011-10-21: qty 1

## 2011-10-21 MED ORDER — METRONIDAZOLE IN NACL 5-0.79 MG/ML-% IV SOLN
500.0000 mg | Freq: Once | INTRAVENOUS | Status: AC
  Administered 2011-10-22: 500 mg via INTRAVENOUS
  Filled 2011-10-21: qty 100

## 2011-10-21 MED ORDER — MORPHINE SULFATE 4 MG/ML IJ SOLN: 4.0000 mg | Freq: Once | INTRAMUSCULAR | Status: DC

## 2011-10-21 MED ORDER — KETOROLAC TROMETHAMINE 30 MG/ML IJ SOLN
30.0000 mg | Freq: Once | INTRAMUSCULAR | Status: AC
  Administered 2011-10-21: 30 mg via INTRAVENOUS
  Filled 2011-10-21: qty 1

## 2011-10-21 MED ORDER — IOHEXOL 300 MG/ML  SOLN
100.0000 mL | Freq: Once | INTRAMUSCULAR | Status: AC | PRN
  Administered 2011-10-21: 100 mL via INTRAVENOUS

## 2011-10-21 MED ORDER — ONDANSETRON HCL 4 MG/2ML IJ SOLN
4.0000 mg | Freq: Once | INTRAMUSCULAR | Status: AC
  Administered 2011-10-21: 4 mg via INTRAVENOUS
  Filled 2011-10-21: qty 2

## 2011-10-21 NOTE — ED Notes (Signed)
Pt reports sudden onset of nausea and diarrhea around 130pm and was followed by numerous episodes of vomiting at 4pm today. Here for continued N/V/D. Denies fevers.

## 2011-10-21 NOTE — ED Provider Notes (Signed)
History     CSN: [id number]  Arrival date & time 10/21/11  1940   First MD Initiated Contact with Patient 10/21/11 2038      Chief Complaint  Patient presents with  . Emesis    (Consider location/radiation/quality/duration/timing/severity/associated sxs/prior treatment) Patient is a 41 y.o. female presenting with vomiting. The history is provided by the patient.  Emesis  This is a new problem. The current episode started 3 to 5 hours ago. The problem occurs continuously. The problem has not changed since onset.The emesis has an appearance of stomach contents. There has been no fever. Associated symptoms include abdominal pain. Pertinent negatives include no chills and no fever.    Past Medical History  Diagnosis Date  . Chest pain     Dr. Riley Kill  . Sinusitis     Acute-on-chronic, remote history of exercise-induced asthma  . Pneumonia 2007    hospital X 2 weeks  . Drug allergy     multiple- FActine - "Nearly killed me" in 2007 at admission. was in ICU. -Rocephin-2007- tongue swelling- Zpak- Rash in 2010    Past Surgical History  Procedure Date  . 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 Nasal sinus surgery 2008    X 4, Revision right total ethmoidectomy with right frontal recess expliration, left frontal recess exploration, bilateral sphenoidotomies  . Cystectomy     vaginal cyst    Family History  Problem Relation Age of Onset  . Diabetes Father   . Hypertension Mother   . Hypertension Maternal Uncle   . Hypertension Maternal Grandmother   . Thyroid cancer Sister   . COPD Mother     smokes    History  Substance Use Topics  . Smoking status: Never Smoker   . Smokeless tobacco: Not on file  . Alcohol Use: No    OB History    Grav Para Term Preterm Abortions TAB SAB Ect Mult Living                  Review of Systems  Constitutional: Negative for fever and chills.  Gastrointestinal: Positive for vomiting and abdominal pain.  All  other systems reviewed and are negative.    Allergies  Amoxicillin-pot clavulanate; Ceftriaxone sodium; Quinolones; Erythromycin; Morphine; Sulfa antibiotics; and Azithromycin  Home Medications   Current Outpatient Rx  Name Route Sig Dispense Refill  . BECLOMETHASONE DIPROPIONATE 80 MCG/ACT IN AERS Inhalation Inhale 3 puffs into the lungs.     Marland Kitchen CLINDAMYCIN HCL 300 MG PO CAPS Oral Take 300 mg by mouth 3 (three) times daily.    Marland Kitchen LEVONORGESTREL 20 MCG/24HR IU IUD Intrauterine 1 each by Intrauterine route once. Inserted in 2011    . MONTELUKAST SODIUM 10 MG PO TABS Oral Take 10 mg by mouth at bedtime.    . ALBUTEROL SULFATE HFA 108 (90 BASE) MCG/ACT IN AERS Inhalation Inhale 2 puffs into the lungs every 4 (four) hours as needed. For shortness of breath     . EPINEPHRINE 0.3 MG/0.3ML IJ DEVI  as directed.        BP 122/62  Pulse 94  Temp(Src) 98.3 F (36.8 C) (Oral)  Resp 18  Ht 5\' 2"  (1.575 m)  Wt 164 lb (74.39 kg)  BMI 30.00 kg/m2  SpO2 100%  Physical Exam  Nursing note and vitals reviewed. Constitutional: She is oriented to person, place, and time. She appears well-developed and well-nourished. No distress.  HENT:  Head: Normocephalic  and atraumatic.  Mouth/Throat: Oropharynx is clear and moist.  Neck: Normal range of motion. Neck supple.  Cardiovascular: Normal rate and regular rhythm.   Pulmonary/Chest: Effort normal and breath sounds normal.  Abdominal: Soft. Bowel sounds are normal. She exhibits no distension.       There is ttp in all four quadrants.  No rebound or guarding.  Musculoskeletal: Normal range of motion.  Lymphadenopathy:    She has no cervical adenopathy.  Neurological: She is alert and oriented to person, place, and time.  Skin: Skin is warm and dry. She is not diaphoretic.    ED Course  Procedures (including critical care time)  Labs Reviewed  URINALYSIS, ROUTINE W REFLEX MICROSCOPIC - Abnormal; Notable for the following:    Ketones, ur >80 (*)     All other components within normal limits  PREGNANCY, URINE  CBC  DIFFERENTIAL  COMPREHENSIVE METABOLIC PANEL  LIPASE, BLOOD   No results found.   No diagnosis found.    MDM  The ct shows acute appendicitis.  I spoke with Dr. Derrell Lolling from surgery who agrees to accept the patient in transfer.  She has been given flagyl iv.          Geoffery Lyons, MD 10/21/11 [phone]

## 2011-10-22 ENCOUNTER — Encounter (HOSPITAL_COMMUNITY): Payer: Self-pay | Admitting: Anesthesiology

## 2011-10-22 ENCOUNTER — Inpatient Hospital Stay (HOSPITAL_COMMUNITY): Payer: 59 | Admitting: Anesthesiology

## 2011-10-22 ENCOUNTER — Encounter (HOSPITAL_COMMUNITY): Admission: EM | Disposition: A | Discharge status: Home / Self Care | Payer: Self-pay | Source: Home / Self Care

## 2011-10-22 ENCOUNTER — Encounter (HOSPITAL_COMMUNITY): Payer: Self-pay | Admitting: Orthopedic Surgery

## 2011-10-22 DIAGNOSIS — K3580 Unspecified acute appendicitis: Secondary | ICD-10-CM

## 2011-10-22 HISTORY — PX: APPENDECTOMY: SHX54

## 2011-10-22 HISTORY — PX: LAPAROSCOPIC APPENDECTOMY: SHX408

## 2011-10-22 SURGERY — APPENDECTOMY, LAPAROSCOPIC
Anesthesia: General | Site: Abdomen | Wound class: Contaminated

## 2011-10-22 MED ORDER — FLUTICASONE PROPIONATE HFA 44 MCG/ACT IN AERO
2.0000 | INHALATION_SPRAY | Freq: Two times a day (BID) | RESPIRATORY_TRACT | Status: DC
  Administered 2011-10-22: 2 via RESPIRATORY_TRACT
  Filled 2011-10-22: qty 10.6

## 2011-10-22 MED ORDER — SODIUM CHLORIDE 0.9 % IV SOLN
INTRAVENOUS | Status: DC
  Administered 2011-10-22 (×2): via INTRAVENOUS

## 2011-10-22 MED ORDER — ALBUTEROL SULFATE HFA 108 (90 BASE) MCG/ACT IN AERS
2.0000 | INHALATION_SPRAY | RESPIRATORY_TRACT | Status: DC | PRN
  Filled 2011-10-22: qty 6.7

## 2011-10-22 MED ORDER — GLYCOPYRROLATE 0.2 MG/ML IJ SOLN
INTRAMUSCULAR | Status: DC | PRN
  Administered 2011-10-22: .7 mg via INTRAVENOUS

## 2011-10-22 MED ORDER — SODIUM CHLORIDE 0.9 % IV BOLUS (SEPSIS)
500.0000 mL | Freq: Once | INTRAVENOUS | Status: AC
  Administered 2011-10-22: 500 mL via INTRAVENOUS

## 2011-10-22 MED ORDER — BUPIVACAINE-EPINEPHRINE 0.25% -1:200000 IJ SOLN
INTRAMUSCULAR | Status: DC | PRN
  Administered 2011-10-22: 8 mL

## 2011-10-22 MED ORDER — EPHEDRINE SULFATE 50 MG/ML IJ SOLN
INTRAMUSCULAR | Status: DC | PRN
  Administered 2011-10-22: 5 mg via INTRAVENOUS

## 2011-10-22 MED ORDER — HYDROCODONE-ACETAMINOPHEN 5-325 MG PO TABS
1.0000 | ORAL_TABLET | ORAL | Status: DC | PRN
  Administered 2011-10-22 (×3): 1 via ORAL
  Filled 2011-10-22 (×3): qty 1

## 2011-10-22 MED ORDER — SUCCINYLCHOLINE CHLORIDE 20 MG/ML IJ SOLN
INTRAMUSCULAR | Status: DC | PRN
  Administered 2011-10-22: 80 mg via INTRAVENOUS

## 2011-10-22 MED ORDER — ACETAMINOPHEN 325 MG PO TABS: 650.0000 mg | ORAL_TABLET | ORAL | Status: DC | PRN

## 2011-10-22 MED ORDER — ONDANSETRON HCL 4 MG/2ML IJ SOLN: 4.0000 mg | Freq: Four times a day (QID) | INTRAMUSCULAR | Status: DC | PRN

## 2011-10-22 MED ORDER — PROMETHAZINE HCL 25 MG/ML IJ SOLN: 6.2500 mg | INTRAMUSCULAR | Status: DC | PRN

## 2011-10-22 MED ORDER — LACTATED RINGERS IV SOLN: INTRAVENOUS | Status: DC

## 2011-10-22 MED ORDER — HYDROCODONE-ACETAMINOPHEN 5-325 MG PO TABS: 1.0000 | ORAL_TABLET | ORAL | Status: AC | PRN

## 2011-10-22 MED ORDER — BUDESONIDE 0.5 MG/2ML IN SUSP
1.0000 mg | Freq: Every day | RESPIRATORY_TRACT | Status: DC
  Administered 2011-10-22: 0.5 mg via RESPIRATORY_TRACT
  Filled 2011-10-22: qty 4

## 2011-10-22 MED ORDER — HEPARIN SODIUM (PORCINE) 5000 UNIT/ML IJ SOLN: 5000.0000 [IU] | Freq: Three times a day (TID) | INTRAMUSCULAR | Status: DC

## 2011-10-22 MED ORDER — LIDOCAINE HCL (CARDIAC) 20 MG/ML IV SOLN
INTRAVENOUS | Status: DC | PRN
  Administered 2011-10-22: 50 mg via INTRAVENOUS

## 2011-10-22 MED ORDER — SODIUM CHLORIDE 0.9 % IV SOLN: INTRAVENOUS | Status: DC

## 2011-10-22 MED ORDER — NEOSTIGMINE METHYLSULFATE 1 MG/ML IJ SOLN
INTRAMUSCULAR | Status: DC | PRN
  Administered 2011-10-22: 4 mg via INTRAVENOUS

## 2011-10-22 MED ORDER — HEPARIN SODIUM (PORCINE) 5000 UNIT/ML IJ SOLN
5000.0000 [IU] | Freq: Three times a day (TID) | INTRAMUSCULAR | Status: DC
  Administered 2011-10-22: 5000 [IU] via SUBCUTANEOUS
  Filled 2011-10-22 (×3): qty 1

## 2011-10-22 MED ORDER — HYDROMORPHONE HCL PF 1 MG/ML IJ SOLN
INTRAMUSCULAR | Status: AC
  Filled 2011-10-22: qty 1

## 2011-10-22 MED ORDER — HYDROMORPHONE HCL PF 1 MG/ML IJ SOLN: 1.0000 mg | INTRAMUSCULAR | Status: DC | PRN

## 2011-10-22 MED ORDER — BUDESONIDE 0.5 MG/2ML IN SUSP
1.0000 mg | Freq: Every day | RESPIRATORY_TRACT | Status: DC
  Filled 2011-10-22: qty 4

## 2011-10-22 MED ORDER — HYDROMORPHONE HCL PF 1 MG/ML IJ SOLN
0.2500 mg | INTRAMUSCULAR | Status: DC | PRN
  Administered 2011-10-22: 0.5 mg via INTRAVENOUS

## 2011-10-22 MED ORDER — SODIUM CHLORIDE 0.9 % IV SOLN
1.0000 g | Freq: Every day | INTRAVENOUS | Status: DC
  Filled 2011-10-22: qty 1

## 2011-10-22 MED ORDER — PROPOFOL 10 MG/ML IV BOLUS
INTRAVENOUS | Status: DC | PRN
  Administered 2011-10-22: 120 mg via INTRAVENOUS

## 2011-10-22 MED ORDER — LACTATED RINGERS IR SOLN
Status: DC | PRN
  Administered 2011-10-22: 1000 mL

## 2011-10-22 MED ORDER — POTASSIUM CHLORIDE IN NACL 20-0.9 MEQ/L-% IV SOLN
INTRAVENOUS | Status: DC
  Administered 2011-10-22: 150 mL/h via INTRAVENOUS
  Administered 2011-10-22: 07:00:00 via INTRAVENOUS
  Filled 2011-10-22 (×4): qty 1000

## 2011-10-22 MED ORDER — BUPIVACAINE-EPINEPHRINE PF 0.25-1:200000 % IJ SOLN
INTRAMUSCULAR | Status: AC
  Filled 2011-10-22: qty 30

## 2011-10-22 MED ORDER — ONDANSETRON HCL 4 MG/2ML IJ SOLN
INTRAMUSCULAR | Status: DC | PRN
  Administered 2011-10-22: 4 mg via INTRAVENOUS

## 2011-10-22 MED ORDER — SODIUM CHLORIDE 0.9 % IV SOLN
1.0000 g | Freq: Once | INTRAVENOUS | Status: AC
  Administered 2011-10-22 (×2): 1 g via INTRAVENOUS
  Filled 2011-10-22: qty 1

## 2011-10-22 MED ORDER — IBUPROFEN 200 MG PO TABS: ORAL_TABLET | ORAL | Status: DC

## 2011-10-22 MED ORDER — ONDANSETRON HCL 4 MG PO TABS: 4.0000 mg | ORAL_TABLET | Freq: Four times a day (QID) | ORAL | Status: DC | PRN

## 2011-10-22 MED ORDER — LACTATED RINGERS IV SOLN: INTRAVENOUS | Status: DC | PRN

## 2011-10-22 MED ORDER — ROCURONIUM BROMIDE 100 MG/10ML IV SOLN
INTRAVENOUS | Status: DC | PRN
  Administered 2011-10-22: 35 mg via INTRAVENOUS

## 2011-10-22 MED ORDER — DEXAMETHASONE SODIUM PHOSPHATE 10 MG/ML IJ SOLN
INTRAMUSCULAR | Status: DC | PRN
  Administered 2011-10-22: 10 mg via INTRAVENOUS

## 2011-10-22 MED ORDER — MEPERIDINE HCL 50 MG/ML IJ SOLN: 6.2500 mg | INTRAMUSCULAR | Status: DC | PRN

## 2011-10-22 MED ORDER — FENTANYL CITRATE 0.05 MG/ML IJ SOLN
INTRAMUSCULAR | Status: DC | PRN
  Administered 2011-10-22 (×2): 50 ug via INTRAVENOUS
  Administered 2011-10-22: 100 ug via INTRAVENOUS
  Administered 2011-10-22: 50 ug via INTRAVENOUS

## 2011-10-22 MED ORDER — MONTELUKAST SODIUM 10 MG PO TABS
10.0000 mg | ORAL_TABLET | Freq: Every day | ORAL | Status: DC
  Filled 2011-10-22: qty 1

## 2011-10-22 SURGICAL SUPPLY — 43 items
APPLIER CLIP ROT 10 11.4 M/L (STAPLE)
BENZOIN TINCTURE PRP APPL 2/3 (GAUZE/BANDAGES/DRESSINGS) ×2 IMPLANT
CANISTER SUCTION 2500CC (MISCELLANEOUS) ×2 IMPLANT
CLIP APPLIE ROT 10 11.4 M/L (STAPLE) IMPLANT
CLOTH BEACON ORANGE TIMEOUT ST (SAFETY) ×2 IMPLANT
CLSR STERI-STRIP ANTIMIC 1/2X4 (GAUZE/BANDAGES/DRESSINGS) ×2 IMPLANT
CUTTER FLEX LINEAR 45M (STAPLE) ×2 IMPLANT
DECANTER SPIKE VIAL GLASS SM (MISCELLANEOUS) IMPLANT
DERMABOND ADVANCED (GAUZE/BANDAGES/DRESSINGS) ×2
DERMABOND ADVANCED .7 DNX12 (GAUZE/BANDAGES/DRESSINGS) ×2 IMPLANT
DEVICE TROCAR PUNCTURE CLOSURE (ENDOMECHANICALS) ×2 IMPLANT
DRAPE LAPAROSCOPIC ABDOMINAL (DRAPES) ×2 IMPLANT
DRAPE UTILITY 15X26 (DRAPE) ×2 IMPLANT
ELECT REM PT RETURN 9FT ADLT (ELECTROSURGICAL) ×2
ELECTRODE REM PT RTRN 9FT ADLT (ELECTROSURGICAL) ×1 IMPLANT
ENDOLOOP SUT PDS II  0 18 (SUTURE)
ENDOLOOP SUT PDS II 0 18 (SUTURE) IMPLANT
GLOVE BIOGEL PI IND STRL 7.0 (GLOVE) IMPLANT
GLOVE BIOGEL PI INDICATOR 7.0 (GLOVE)
GLOVE EUDERMIC 7 POWDERFREE (GLOVE) ×6 IMPLANT
GOWN STRL NON-REIN LRG LVL3 (GOWN DISPOSABLE) ×2 IMPLANT
GOWN STRL REIN XL XLG (GOWN DISPOSABLE) ×4 IMPLANT
HAND ACTIVATED (MISCELLANEOUS) ×2 IMPLANT
IV LACTATED RINGERS 1000ML (IV SOLUTION) ×2 IMPLANT
KIT BASIN OR (CUSTOM PROCEDURE TRAY) ×2 IMPLANT
PENCIL BUTTON HOLSTER BLD 10FT (ELECTRODE) IMPLANT
POUCH SPECIMEN RETRIEVAL 10MM (ENDOMECHANICALS) ×2 IMPLANT
RELOAD 45 VASCULAR/THIN (ENDOMECHANICALS) ×2 IMPLANT
RELOAD STAPLE TA45 3.5 REG BLU (ENDOMECHANICALS) IMPLANT
SET IRRIG TUBING LAPAROSCOPIC (IRRIGATION / IRRIGATOR) ×2 IMPLANT
SOLUTION ANTI FOG 6CC (MISCELLANEOUS) ×2 IMPLANT
SPONGE GAUZE 4X4 12PLY (GAUZE/BANDAGES/DRESSINGS) ×2 IMPLANT
STRIP CLOSURE SKIN 1/2X4 (GAUZE/BANDAGES/DRESSINGS) ×2 IMPLANT
SUT MNCRL AB 4-0 PS2 18 (SUTURE) ×2 IMPLANT
SUT VICRYL 0 UR6 27IN ABS (SUTURE) ×2 IMPLANT
TAPE CLOTH SURG 4X10 WHT LF (GAUZE/BANDAGES/DRESSINGS) ×2 IMPLANT
TOWEL OR 17X26 10 PK STRL BLUE (TOWEL DISPOSABLE) ×2 IMPLANT
TRAY FOLEY CATH 14FRSI W/METER (CATHETERS) ×2 IMPLANT
TRAY LAP CHOLE (CUSTOM PROCEDURE TRAY) ×2 IMPLANT
TROCAR BLADELESS OPT 5 75 (ENDOMECHANICALS) ×2 IMPLANT
TROCAR XCEL 12X100 BLDLESS (ENDOMECHANICALS) ×2 IMPLANT
TROCAR XCEL BLUNT TIP 100MML (ENDOMECHANICALS) ×2 IMPLANT
TUBING INSUFFLATION 10FT LAP (TUBING) ×2 IMPLANT

## 2011-10-22 NOTE — Care Management Note (Unsigned)
    Page 1 of 2   10/22/2011     4:52:07 PM   CARE MANAGEMENT NOTE 10/22/2011  Patient:  [name] [name], [name] [name]   Account Number:  [id number]  Date Initiated:  10/22/2011  Documentation initiated by:  Colleen Can  Subjective/Objective Assessment:   dx abd pain, nausea & vomiting     Action/Plan:   CM spoke with patient. Plans are for home with spouse as caregiver.No HH needs. possible d/c this pm. Tlerating fluids. Able to walk without assistance   Anticipated DC Date:  10/22/2011   Anticipated DC Plan:  HOME/SELF CARE  In-house referral  NA      DC Planning Services  CM consult      PAC Choice  NA   Choice offered to / List presented to:  NA   DME arranged  NA      DME agency  NA     HH arranged  NA      HH agency  NA   Status of service:  Completed, signed off Medicare Important Message given?  NO (If response is "NO", the following Medicare IM given date fields will be blank) Date Medicare IM given:   Date Additional Medicare IM given:    Discharge Disposition:    Per UR Regulation:  Reviewed for med. necessity/level of care/duration of stay  If discussed at Long Length of Stay Meetings, dates discussed:    Comments:

## 2011-10-22 NOTE — Anesthesia Preprocedure Evaluation (Signed)
Anesthesia Evaluation  Patient identified by MRN, date of birth, ID band Patient awake    Reviewed: Allergy & Precautions, H&P , NPO status , Patient's Chart, lab work & pertinent test results  History of Anesthesia Complications (+) PONV  Airway Mallampati: II TM Distance: >3 FB Neck ROM: Full    Dental No notable dental hx.    Pulmonary neg pulmonary ROS, asthma ,  breath sounds clear to auscultation  Pulmonary exam normal       Cardiovascular negative cardio ROS  Rhythm:Regular Rate:Normal     Neuro/Psych negative neurological ROS  negative psych ROS   GI/Hepatic negative GI ROS, Neg liver ROS,   Endo/Other  negative endocrine ROS  Renal/GU negative Renal ROS  negative genitourinary   Musculoskeletal negative musculoskeletal ROS (+)   Abdominal   Peds negative pediatric ROS (+)  Hematology negative hematology ROS (+)   Anesthesia Other Findings   Reproductive/Obstetrics negative OB ROS                           Anesthesia Physical Anesthesia Plan  ASA: II  Anesthesia Plan: General   Post-op Pain Management:    Induction: Intravenous  Airway Management Planned: Oral ETT  Additional Equipment:   Intra-op Plan:   Post-operative Plan: Extubation in OR  Informed Consent: I have reviewed the patients History and Physical, chart, labs and discussed the procedure including the risks, benefits and alternatives for the proposed anesthesia with the patient or authorized representative who has indicated his/her understanding and acceptance.   Dental advisory given  Plan Discussed with: CRNA  Anesthesia Plan Comments:         Anesthesia Quick Evaluation

## 2011-10-22 NOTE — Progress Notes (Addendum)
Day of Surgery  Subjective: Pt still waking up after surgery.  Feels as well as can be expected, better than  On admission.  She hasn't been on floor long enough to be checked in yet.  Objective: Vital signs in last 24 hours: Temp:  [97 F (36.1 C)-98.8 F (37.1 C)] 97.3 F (36.3 C) (05/08 0605) Pulse Rate:  [57-94] 57  (05/08 0605) Resp:  [13-18] 16  (05/08 0605) BP: (92-122)/(39-64) 92/64 mmHg (05/08 0605) SpO2:  [98 %-100 %] 100 % (05/08 0605) Weight:  [74.39 kg (164 lb)] 74.39 kg (164 lb) (05/08 0615) Last BM Date: 10/21/11  Afebrile, BP 92-101 range, HR 84-57, No labs   Intake/Output from previous day: 05/07 0701 - 05/08 0700 In: 1900 [I.V.:1900] Out: 100 [Urine:100] Intake/Output this shift:    General appearance: alert, cooperative and no distress Resp: clear to auscultation bilaterally GI: soft, bowel sounds hypoactive, no distension, incisions look good.  Lab Results:   [facility] 10/21/11 2047  WBC 19.8*  HGB 14.8  HCT 43.4  PLT 229    BMET  Basename 10/21/11 2047  NA 137  K 4.0  CL 101  CO2 28  GLUCOSE 101*  BUN 12  CREATININE 0.80  CALCIUM 9.6   PT/INR No results found for this basename: LABPROT:2,INR:2 in the last 72 hours   Lab 10/21/11 2047  AST 16  ALT 11  ALKPHOS 63  BILITOT 1.4*  PROT 6.9  ALBUMIN 4.2     Lipase     Component Value Date/Time   LIPASE 27 10/21/2011 2047     Studies/Results: Ct Abdomen Pelvis W Contrast  10/21/2011  *RADIOLOGY REPORT*  Clinical Data: Nausea, vomiting, abdominal pain and leukocytosis.  CT ABDOMEN AND PELVIS WITH CONTRAST  Technique:  Multidetector CT imaging of the abdomen and pelvis was performed following the standard protocol during bolus administration of intravenous contrast.  Contrast: OMNIPAQUE IOHEXOL 300 MG/ML  SOLN  Comparison: None.  Findings: The visualized lung bases are clear.  The liver and spleen are unremarkable in appearance.  The gallbladder is within normal limits.  The  pancreas and adrenal glands are unremarkable.  A 6 mm cyst is noted at the lower pole of the right kidney; a few smaller cysts are suggested.  The kidneys are otherwise unremarkable in appearance.  There is no evidence of hydronephrosis.  No renal or ureteral stones are seen.  No perinephric stranding is appreciated.  No free fluid is identified.  The small bowel is unremarkable in appearance.  The stomach is within normal limits.  No acute vascular abnormalities are seen.  The appendix is distended to 1.0 cm in diameter, with apparent wall thickening and minimal surrounding soft tissue stranding.  This raises suspicion for mild appendicitis.  There is no evidence for perforation or abscess formation.  The colon is grossly unremarkable in appearance.  The bladder is mildly distended and grossly unremarkable.  Vague hypodensity is noted at the cervix; would correlate with Pap smear. An intrauterine device is seen at the fundus of the cervix, in expected position.  There is diffuse prominence of the periuterine vasculature, and prominence of the gonadal veins bilaterally; in the appropriate clinical situation, this could reflect pelvic congestion syndrome.  No inguinal lymphadenopathy is seen.  No acute osseous abnormalities are identified.  IMPRESSION:  1.  Suspect mild acute appendicitis, with distension of the appendix to 1.0 cm in diameter, apparent wall thickening and minimal soft tissue stranding.  No evidence for perforation or abscess  formation. 2.  Vague hypodensity noted at the cervix; would correlate with Pap smear to exclude malignancy.  3.  Diffuse prominence of the periuterine vasculature, and prominence of the bilateral gonadal veins; in the appropriate clinical situation, this could reflect pelvic congestion syndrome. 4.  Small right renal cysts seen.  These results were called by telephone on 10/21/2011  at  11:35 p.m. to  [name] [name], who verbally acknowledged these results.  Original Report  Authenticated By: [name] [name], M.D.    Medications:    . sodium chloride   Intravenous Once  . budesonide  1 mg Nebulization Daily  . ertapenem  1 g Intravenous Once  . ertapenem (INVANZ) IV  1 g Intravenous QHS  . fluticasone  2 puff Inhalation BID  . heparin  5,000 Units Subcutaneous Q8H  . HYDROmorphone      .  HYDROmorphone (DILAUDID) injection  1 mg Intravenous Once  . ketorolac  30 mg Intravenous Once  . metronidazole  500 mg Intravenous Once  . montelukast  10 mg Oral QHS  . ondansetron (ZOFRAN) IV  4 mg Intravenous Once  . DISCONTD: heparin  5,000 Units Subcutaneous Q8H  . DISCONTD:  morphine injection  4 mg Intravenous Once    Assessment/Plan Acute appendicitis Hx chest pain  Dr. Riley [name] Sinusitis   Plan:  Give her some time to get over anesthesia, mobilize, advance diet.  Home later today, or tomorrow as she progresses.  1700 hours:  She feels good.  We have given her some extra fluid today because her BP was down.  She says it got better as soon as she read her e-mails from work.  I will check orthostatic pressures and if OK let her go home tonight.  She will be with her husband and daughter.  If she's still orthostatic I will keep her tonight and continue fluids.      LOS: 1 day    [name] [name] 10/22/2011

## 2011-10-22 NOTE — Transfer of Care (Signed)
Immediate Anesthesia Transfer of Care Note  Patient: [name] [name]  Procedure(s) Performed: Procedure(s) (LRB): APPENDECTOMY LAPAROSCOPIC (N/A)  Patient Location: PACU  Anesthesia Type: General  Level of Consciousness: sedated, patient cooperative and responds to stimulaton  Airway & Oxygen Therapy: Patient Spontanous Breathing and Patient connected to face mask oxgen  Post-op Assessment: Report given to PACU RN and Post -op Vital signs reviewed and stable  Post vital signs: Reviewed and stable  Complications: No apparent anesthesia complications

## 2011-10-22 NOTE — Progress Notes (Signed)
I have seen and examined the patient and agree with the assessment and plans.  Brennah Quraishi A. Olina Melfi  MD, FACS  

## 2011-10-22 NOTE — Progress Notes (Signed)
I have seen and examined the patient and agree with the assessment and plans.  Rayen Palen A. Milca Sytsma  MD, FACS  

## 2011-10-22 NOTE — H&P (Signed)
[name] [name] is an 41 y.o. female.    Chief Complaint: abdominal pain and vomiting  HPI:  This is a 41 year old Caucasian female who was well yesterday. At about 1:00 PM she felt somewhat nauseated and went to the bathroom and had some soft bowel movements but no diarrhea. Shortly thereafter she developed lower abdominal pain which was fairly constant in nature and associated with several episodes of vomiting. This was progressive. No prior similar problems. No fever or chills. Last menstrual period April 24. IUD in place.  She went to [facility] and was evaluated because of progressive symptoms. White blood cell count was 19,000. Noticed to have right lower quadrant tenderness. CT scan is consistent with an inflamed appendix with no evidence of rupture or abscess. She was transferred to [facility] emergency room for surgical evaluation and is being admitted and prepare for appendectomy.  Past history is significant for recurrent sinusitis and cough. She is currently being evaluated by rheumatology and says they are questioning whether she may have Wegener's granulomatosis. 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 Also allergic to penicillin erythromycin and sulfa and Zithromax.  Past Medical History  Diagnosis Date  . Chest pain     Dr. Riley Kill  . Sinusitis     Acute-on-chronic, remote history of exercise-induced asthma  . Pneumonia 2007    hospital X 2 weeks  . Drug allergy     multiple- FActine - "Nearly killed me" in 2007 at admission. was in ICU. -Rocephin-2007- tongue swelling- Zpak- Rash in 2010    Past Surgical History  Procedure Date  . 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 Nasal sinus surgery 2008    X 4, Revision right total ethmoidectomy with right frontal recess expliration, left frontal recess exploration, bilateral sphenoidotomies  . Cystectomy     vaginal cyst    Family History  Problem Relation Age  of Onset  . Diabetes Father   . Hypertension Mother   . Hypertension Maternal Uncle   . Hypertension Maternal Grandmother   . Thyroid cancer Sister   . COPD Mother     smokes   Social History:  reports that she has never smoked. She does not have any smokeless tobacco history on file. She reports that she does not drink alcohol or use illicit drugs.  Allergies:  Allergies  Allergen Reactions  . Amoxicillin-Pot Clavulanate Anaphylaxis  . Ceftriaxone Sodium Anaphylaxis  . Quinolones Anaphylaxis  . Erythromycin Diarrhea and Nausea And Vomiting  . Morphine Itching  . Sulfa Antibiotics Hives  . Azithromycin Hives and Rash     (Not in a hospital admission)  Results for orders placed during the hospital encounter of 10/21/11 (from the past 48 hour(s))  URINALYSIS, ROUTINE W REFLEX MICROSCOPIC     Status: Abnormal   Collection Time   10/21/11  7:57 PM      Component Value Range Comment   Color, Urine YELLOW  YELLOW     APPearance CLEAR  CLEAR     Specific Gravity, Urine 1.025  1.005 - 1.030     pH 6.0  5.0 - 8.0     Glucose, UA NEGATIVE  NEGATIVE (mg/dL)    Hgb urine dipstick NEGATIVE  NEGATIVE     Bilirubin Urine NEGATIVE  NEGATIVE     Ketones, ur >80 (*) NEGATIVE (mg/dL)    Protein, ur NEGATIVE  NEGATIVE (mg/dL)    Urobilinogen, UA 0.2  0.0 - 1.0 (mg/dL)    Nitrite NEGATIVE  NEGATIVE     Leukocytes, UA NEGATIVE  NEGATIVE  MICROSCOPIC NOT DONE ON URINES WITH NEGATIVE PROTEIN, BLOOD, LEUKOCYTES, NITRITE, OR GLUCOSE <1000 mg/dL.  PREGNANCY, URINE     Status: Normal   Collection Time   10/21/11  7:57 PM      Component Value Range Comment   Preg Test, Ur NEGATIVE  NEGATIVE    CBC     Status: Abnormal   Collection Time   10/21/11  8:47 PM      Component Value Range Comment   WBC 19.8 (*) 4.0 - 10.5 (K/uL)    RBC 4.93  3.87 - 5.11 (MIL/uL)    Hemoglobin 14.8  12.0 - 15.0 (g/dL)    HCT 16.1  09.6 - 04.5 (%)    MCV 88.0  78.0 - 100.0 (fL)    MCH 30.0  26.0 - 34.0 (pg)    MCHC  34.1  30.0 - 36.0 (g/dL)    RDW 40.9  81.1 - 91.4 (%)    Platelets 229  150 - 400 (K/uL)   DIFFERENTIAL     Status: Abnormal   Collection Time   10/21/11  8:47 PM      Component Value Range Comment   Neutrophils Relative 90 (*) 43 - 77 (%)    Neutro Abs 17.8 (*) 1.7 - 7.7 (K/uL)    Lymphocytes Relative 4 (*) 12 - 46 (%)    Lymphs Abs 0.8  0.7 - 4.0 (K/uL)    Monocytes Relative 6  3 - 12 (%)    Monocytes Absolute 1.2 (*) 0.1 - 1.0 (K/uL)    Eosinophils Relative 0  0 - 5 (%)    Eosinophils Absolute 0.0  0.0 - 0.7 (K/uL)    Basophils Relative 0  0 - 1 (%)    Basophils Absolute 0.0  0.0 - 0.1 (K/uL)   COMPREHENSIVE METABOLIC PANEL     Status: Abnormal   Collection Time   10/21/11  8:47 PM      Component Value Range Comment   Sodium 137  135 - 145 (mEq/L)    Potassium 4.0  3.5 - 5.1 (mEq/L)    Chloride 101  96 - 112 (mEq/L)    CO2 28  19 - 32 (mEq/L)    Glucose, Bld 101 (*) 70 - 99 (mg/dL)    BUN 12  6 - 23 (mg/dL)    Creatinine, Ser 7.82  0.50 - 1.10 (mg/dL)    Calcium 9.6  8.4 - 10.5 (mg/dL)    Total Protein 6.9  6.0 - 8.3 (g/dL)    Albumin 4.2  3.5 - 5.2 (g/dL)    AST 16  0 - 37 (U/L)    ALT 11  0 - 35 (U/L)    Alkaline Phosphatase 63  39 - 117 (U/L)    Total Bilirubin 1.4 (*) 0.3 - 1.2 (mg/dL)    GFR calc non Af Amer >90  >90 (mL/min)    GFR calc Af Amer >90  >90 (mL/min)   LIPASE, BLOOD     Status: Normal   Collection Time   10/21/11  8:47 PM      Component Value Range Comment   Lipase 27  11 - 59 (U/L)    Ct Abdomen Pelvis W Contrast  10/21/2011  *RADIOLOGY REPORT*  Clinical Data: Nausea, vomiting, abdominal pain and leukocytosis.  CT ABDOMEN AND PELVIS WITH CONTRAST  Technique:  Multidetector CT imaging of the  abdomen and pelvis was performed following the standard protocol during bolus administration of intravenous contrast.  Contrast: OMNIPAQUE IOHEXOL 300 MG/ML  SOLN  Comparison: None.  Findings: The visualized lung bases are clear.  The liver and spleen are  unremarkable in appearance.  The gallbladder is within normal limits.  The pancreas and adrenal glands are unremarkable.  A 6 mm cyst is noted at the lower pole of the right kidney; a few smaller cysts are suggested.  The kidneys are otherwise unremarkable in appearance.  There is no evidence of hydronephrosis.  No renal or ureteral stones are seen.  No perinephric stranding is appreciated.  No free fluid is identified.  The small bowel is unremarkable in appearance.  The stomach is within normal limits.  No acute vascular abnormalities are seen.  The appendix is distended to 1.0 cm in diameter, with apparent wall thickening and minimal surrounding soft tissue stranding.  This raises suspicion for mild appendicitis.  There is no evidence for perforation or abscess formation.  The colon is grossly unremarkable in appearance.  The bladder is mildly distended and grossly unremarkable.  Vague hypodensity is noted at the cervix; would correlate with Pap smear. An intrauterine device is seen at the fundus of the cervix, in expected position.  There is diffuse prominence of the periuterine vasculature, and prominence of the gonadal veins bilaterally; in the appropriate clinical situation, this could reflect pelvic congestion syndrome.  No inguinal lymphadenopathy is seen.  No acute osseous abnormalities are identified.  IMPRESSION:  1.  Suspect mild acute appendicitis, with distension of the appendix to 1.0 cm in diameter, apparent wall thickening and minimal soft tissue stranding.  No evidence for perforation or abscess formation. 2.  Vague hypodensity noted at the cervix; would correlate with Pap smear to exclude malignancy.  3.  Diffuse prominence of the periuterine vasculature, and prominence of the bilateral gonadal veins; in the appropriate clinical situation, this could reflect pelvic congestion syndrome. 4.  Small right renal cysts seen.  These results were called by telephone on 10/21/2011  at  11:35 p.m. to  Dr.  Geoffery Lyons, who verbally acknowledged these results.  Original Report Authenticated By: Tonia Ghent, M.D.    Review of Systems  Constitutional: Positive for malaise/fatigue.  HENT: Positive for congestion. Negative for hearing loss, nosebleeds and neck pain.   Eyes: Negative for blurred vision, double vision, photophobia and pain.  Respiratory: Positive for cough and shortness of breath. Negative for hemoptysis, sputum production and wheezing.   Cardiovascular: Negative for chest pain, palpitations, orthopnea, claudication and leg swelling.  Gastrointestinal: Positive for nausea, vomiting and abdominal pain. Negative for heartburn, diarrhea, constipation, blood in stool and melena.  Genitourinary: Negative for dysuria, urgency, frequency and hematuria.  Musculoskeletal: Negative for myalgias, back pain and joint pain.  Skin: Negative for itching and rash.  Neurological: Negative for dizziness, tingling, tremors, sensory change, speech change, focal weakness and headaches.  Endo/Heme/Allergies: Negative for polydipsia. Does not bruise/bleed easily.  Psychiatric/Behavioral: Negative for depression, suicidal ideas, hallucinations and substance abuse. The patient is not nervous/anxious and does not have insomnia.     Blood pressure 99/51, pulse 75, temperature 98.4 F (36.9 C), temperature source Oral, resp. rate 16, height 5\' 2"  (1.575 m), weight 164 lb (74.39 kg), SpO2 98.00%. Physical Exam  Constitutional: She is oriented to person, place, and time. She appears well-developed and well-nourished. She appears distressed.  HENT:  Head: Normocephalic and atraumatic.  Nose: Nose normal.  Mouth/Throat: No oropharyngeal exudate.  Eyes: Conjunctivae and EOM are normal. Pupils are equal, round, and reactive to light. Right eye exhibits no discharge. Left eye exhibits no discharge. No scleral icterus.  Neck: Normal range of motion. Neck supple. No JVD present. No tracheal deviation present. No  thyromegaly present.  Cardiovascular: Normal rate, regular rhythm, normal heart sounds and intact distal pulses.   No murmur heard. Respiratory: Effort normal and breath sounds normal. No stridor. No respiratory distress. She has no wheezes. She has no rales. She exhibits no tenderness.  GI: Soft. She exhibits no distension and no mass. There is tenderness. There is rebound and guarding.       Tender with some guarding right lower quadrant and suprapubic area. No distention. No mass. No scars. No hernia.  Musculoskeletal: Normal range of motion. She exhibits no edema and no tenderness.  Lymphadenopathy:    She has no cervical adenopathy.  Neurological: She is alert and oriented to person, place, and time.  Skin: Skin is warm and dry. No rash noted. She is not diaphoretic. No erythema. No pallor.  Psychiatric: She has a normal mood and affect. Her behavior is normal. Judgment and thought content normal.     Assessment/Plan  Acute appendicitis. At this point in time it appears that she has not yet ruptured her appendix and there is no diffuse peritonitis.  Recurrent sinusitis and pulmonary problems, currently under evaluation by rheumatology.  The patient will be started on intravenous antibiotics and will be taken to the operating room for a laparoscopic appendectomy, possible open  I discussed the indications and details, techniques and risks with the patient and her husband. They're aware of the possible conversion to open laparotomy. Disability and return to work issues were discussed. They understand these issues. Their questions were answered. They agree with this plan.    Angelia Mould. Derrell Lolling, M.D., [facility] General and Minimally invasive Surgery Breast and Colorectal Surgery Office:   [phone] Pager:   [phone]  10/22/2011, 2:52 AM

## 2011-10-22 NOTE — ED Notes (Signed)
Dr. Derrell Lolling notified regarding allergies.  Invanz med verified with allergies and approved to administer.  Notified JC in pharmacy.

## 2011-10-22 NOTE — Op Note (Signed)
Patient Name:           [name] [name]   Date of Surgery:        10/22/2011  Pre op Diagnosis:      Acute appendicitis  Post op Diagnosis:    Acute appendicitis  Procedure:                 Laparoscopic appendectomy  Surgeon:                     Angelia Mould. Derrell Lolling, M.D., FACS  Assistant:                      none  Operative Indications:     This is a 41 year old Caucasian female who was well yesterday. At about 1:00 PM she felt somewhat nauseated and went to the bathroom and had some soft bowel movements but no diarrhea. Shortly thereafter she developed lower abdominal pain which was fairly constant in nature and associated with several episodes of vomiting. This was progressive. No prior similar problems. No fever or chills. Last menstrual period April 24.   IUD in place.  She went to [facility] and was evaluated because of progressive symptoms. White blood cell count was 19,000. Noticed to have right lower quadrant tenderness. CT scan is consistent with an inflamed appendix with no evidence of rupture or abscess. She was transferred to [facility] emergency room for surgical evaluation and is being admitted and prepared for appendectomy.  Operative Findings:       The patient had acute appendicitis. The appendix was distended and inflamed and edematous but had not ruptured. There was some cloudy fluid in the pelvis. The uterus and ovaries looked normal. The terminal ileum and cecum looked normal. The liver and stomach looked normal.  Procedure in Detail:          Following the induction of general endotracheal anesthesia a Foley catheter was placed, the abdomen was prepped and draped in a sterile fashion, intravenous antibiotics were given preoperatively, and a surgical time out was performed. 0.5% Marcaine with epinephrine was used as a local infiltration anesthetic. A vertically oriented incision was made at the superior rim of the umbilicus. The fascia was incised in the midline and the  abdominal cavity entered under direct vision. An 11 mm Hassan trocar was inserted and secured with pursestring suture of 0 Vicryl. Pneumoperitoneum was treated and videocamera was inserted. A 5 mm trocar was placed in the left lower quadrant and a 12 mm trocar in the suprapubic area. After surveying the anatomy we mobilized the appendix and dissected it off of the inflammatory adhesions to the omentum. We could see the appendiceal mesentery and this was divided in sequential steps with the Harmonic scalpel until we had completely mobilized the appendix. An Endo GIA stapling device was placed transversely across the base of the appendix at the level of insertion on the cecum. The stapler was closed and fired and removed. The appendix was placed in a specimen bag and removed. The staple line looked good. There was no bleeding.  I irrigated out the pelvis to remove the cloudy fluid. Everything looked good. We removed the suprapubic trocar and closed the fascia at the 12 mm port site with an 0 Vicryl using an Endo Close device under direct vision. The pneumoperitoneum was then released. The fascia at the umbilicus closer Vicryl sutures. The skin incisions were closed with subcuticular sutures of 4-0 Monocryl and  Dermabond. The patient tolerated the procedure well and was taken to recovery room stable. There were no complications. EBL 15 cc. . Counts correct.     Angelia Mould. Derrell Lolling, M.D., FACS General and Minimally Invasive Surgery Breast and Colorectal Surgery  10/22/2011 4:51 AM

## 2011-10-22 NOTE — Discharge Instructions (Signed)
Laparoscopic Appendectomy Appendectomy is surgery to remove the appendix. Laparoscopic surgery uses several small cuts (incisions) instead of one large incision. Laparoscopic surgery offers a shorter recovery time and less discomfort. LET YOUR CAREGIVER KNOW ABOUT:  Allergies to food or medicine.   Medicines taken, including vitamins, dietary supplements, herbs, eyedrops, over-the-counter medicines, and creams.   Use of steroids (by mouth or creams).   Previous problems with anesthetics or numbing medicines.   History of bleeding problems or blood clots.   Previous surgery.   Other health problems, including diabetes, heart problems, lung problems, and kidney problems.   Possibility of pregnancy, if this applies.  RISKS AND COMPLICATIONS  Infection. A germ starts growing in the wound. This can usually be treated with antibiotics. In some cases, the wound will need to be opened and cleaned.   Bleeding.   Damage to other organs.   Sores (abscesses).   Chronic pain at the incision sites. This is defined as pain that lasts for more than 3 months.   Blood clots in the legs that may rarely travel to the lungs.   Infection in the lungs (pneumonia).  BEFORE THE PROCEDURE Appendectomy is usually performed immediately after an inflamed appendix (appendicitis) is diagnosed. No preparation is necessary ahead of this procedure. PROCEDURE  You will be given medicine that makes you sleep (general anesthetic). After you are asleep, a flexible tube (catheter) may be inserted into your bladder to drain your urine during surgery. The tube is removed before you wake up after surgery. When you are asleep, carbondioxide gas will be used to inflate your abdomen. This will allow your surgeon to see inside your abdomen and perform your surgery. Three small incisions will be made in your abdomen. Your surgeon will insert a thin, lighted tube (laparoscope) through one of the incisions. Your surgeon will  look through the laparoscope while performing the surgery. Other tools will be inserted through the other incisions. Laparoscopic procedures may not be appropriate when:  There is major scarring from a previous surgery.   The patient has bleeding disorders.   A pregnancy is near term.   There are other conditions which make the laparoscopic procedure impossible, such as an advanced infection or a ruptured appendix.  If your surgeon feels it is not safe to continue with the laparoscopic procedure, he or she will perform an open surgery instead. This gives the surgeon a larger view and more space to work. Open surgery requires a longer recovery time. After your appendix is removed, your incisions will be closed with stitches (sutures) or skin adhesive. AFTER THE PROCEDURE You will be taken to a recovery room. When the anesthesia has worn off, you will be returned to your hospital room. You will be given pain medicines to keep you comfortable. Ask your caregiver how long your hospital stay will be. Document Released: 01/15/2004 Document Revised: 05/22/2011 Document Reviewed: 12/10/2010 Exit[employer] Patient Information 2012 [employer].CCS ______[facility] LAPAROSCOPIC SURGERY: POST OP INSTRUCTIONS Always review your discharge instruction sheet given to you by the facility where your surgery was performed. IF YOU HAVE DISABILITY OR FAMILY LEAVE FORMS, YOU MUST BRING THEM TO THE OFFICE FOR PROCESSING.   DO NOT GIVE THEM TO YOUR DOCTOR.  1. A prescription for pain medication may be given to you upon discharge.  Take your pain medication as prescribed, if needed.  If narcotic pain medicine is not needed, then you may take acetaminophen (Tylenol) or ibuprofen (Advil) as needed. 2. Take your   usually prescribed medications unless otherwise directed. 3. If you need a refill on your pain medication, please contact your pharmacy.  They will contact our office to request authorization.  Prescriptions will not be filled after 5pm or on week-ends. 4. You should follow a light diet the first few days after arrival home, such as soup and crackers, etc.  Be sure to include lots of fluids daily. 5. Most patients will experience some swelling and bruising in the area of the incisions.  Ice packs will help.  Swelling and bruising can take several days to resolve.  6. It is common to experience some constipation if taking pain medication after surgery.  Increasing fluid intake and taking a stool softener (such as Colace) will usually help or prevent this problem from occurring.  A mild laxative (Milk of Magnesia or Miralax) should be taken according to package instructions if there are no bowel movements after 48 hours. 7. Unless discharge instructions indicate otherwise, you may remove your bandages 24-48 hours after surgery, and you may shower at that time.  You may have steri-strips (small skin tapes) in place directly over the incision.  These strips should be left on the skin for 7-10 days.  If your surgeon used skin glue on the incision, you may shower in 24 hours.  The glue will flake off over the next 2-3 weeks.  Any sutures or staples will be removed at the office during your follow-up visit. 8. ACTIVITIES:  You may resume regular (light) daily activities beginning the next day--such as daily self-care, walking, climbing stairs--gradually increasing activities as tolerated.  You may have sexual intercourse when it is comfortable.  Refrain from any heavy lifting or straining until approved by your doctor. a. You may drive when you are no longer taking prescription pain medication, you can comfortably wear a seatbelt, and you can safely maneuver your car and apply brakes. b. RETURN TO WORK:  __________________________________________________________ 9. You should see your doctor in the office for a follow-up appointment approximately 2-3 weeks after your surgery.  Make sure that you call for  this appointment within a day or two after you arrive home to insure a convenient appointment time. 10. OTHER INSTRUCTIONS: __________________________________________________________________________________________________________________________ __________________________________________________________________________________________________________________________ WHEN TO CALL YOUR DOCTOR: 1. Fever over 101.0 2. Inability to urinate 3. Continued bleeding from incision. 4. Increased pain, redness, or drainage from the incision. 5. Increasing abdominal pain  The clinic staff is available to answer your questions during regular business hours.  Please don't hesitate to call and ask to speak to one of the nurses for clinical concerns.  If you have a medical emergency, go to the nearest emergency room or call 911.  A surgeon from [facility] is always on call at the hospital. [address], [address], [place], [place]  [address] ? [address] [address], , [place]   [address] [phone] ? [phone] ? FAX [phone] Web site: [email or web address]  

## 2011-10-22 NOTE — Discharge Summary (Signed)
Physician Discharge Summary  Patient ID: [name] [name] MRN: [id number] DOB/AGE: Feb 17, 1971 41 y.o.  Admit date: 10/21/2011 Discharge date: 10/22/2011  Admission Diagnoses: Acute Appenicitis Sinusitis  Discharge Diagnoses: Same Principal Problem:  *Appendicitis, acute   PROCEDURES: Laparoscopic appendectomy 10/21/11 DR. Pomerado Hospital Course: This is a 41 year old Caucasian female who was well yesterday. At about 1:00 PM she felt somewhat nauseated and went to the bathroom and had some soft bowel movements but no diarrhea. Shortly thereafter she developed lower abdominal pain which was fairly constant in nature and associated with several episodes of vomiting. This was progressive. No prior similar problems. No fever or chills. Last menstrual period April 24. IUD in place.  She went to [facility] and was evaluated because of progressive symptoms. White blood cell count was 19,000. Noticed to have right lower quadrant tenderness. CT scan is consistent with an inflamed appendix with no evidence of rupture or abscess. She was transferred to [facility] emergency room for surgical evaluation and is being admitted and prepared for appendectomy. She tolerated the procedure well.  She returned to the floor and her BP was a bit low.  This resolved with fluids.  She was able to eat, void and ambulate without any problems.  She is sore but would like to go home.  Orthostatic BP's were fine.   Follow up with DR,. Ingram in  2 weeks.  Condition on D/C:  Improved  Disposition:    Medication List  As of 10/22/2011  5:07 PM   TAKE these medications         acetaminophen 325 MG tablet   Commonly known as: TYLENOL   Take 2 tablets (650 mg total) by mouth every 4 (four) hours as needed.      beclomethasone 80 MCG/ACT inhaler   Commonly known as: QVAR   Inhale 3 puffs into the lungs.      budesonide 1 MG/2ML nebulizer solution   Commonly known as: PULMICORT   Place 1 mg into the nose daily. Pt  uses this med as a nose drop, not a nebulizer.  1mg  per nare daily.      clindamycin 300 MG capsule   Commonly known as: CLEOCIN   Take 300 mg by mouth 3 (three) times daily.      EPIPEN 2-PAK 0.3 mg/0.3 mL Devi   Generic drug: EPINEPHrine   as directed.      HYDROcodone-acetaminophen 5-325 MG per tablet   Commonly known as: NORCO   Take 1-2 tablets by mouth every 4 (four) hours as needed.      ibuprofen 200 MG tablet   Commonly known as: ADVIL,MOTRIN   2-3 tablets every 6 hours as needed.  You can use this or plain tylenol.      levonorgestrel 20 MCG/24HR IUD   Commonly known as: MIRENA   1 each by Intrauterine route once. Inserted in 2011      montelukast 10 MG tablet   Commonly known as: SINGULAIR   Take 10 mg by mouth at bedtime.      PROAIR HFA 108 (90 BASE) MCG/ACT inhaler   Generic drug: albuterol   Inhale 2 puffs into the lungs every 4 (four) hours as needed. For shortness of breath             Follow-up Information    Follow up with Ernestene Mention, MD. Schedule an appointment as soon as possible for a visit in 3 weeks.   Contact information:  [facility] [address] [place] [address] [phone]       Follow up with Return to work next week.  Don't lift over 20 pounds for  4 weeks.      Schedule an appointment as soon as possible for a visit with BURNETT,BRENT A, MD. (As needed)    Contact information:   [address] [place] [address] [phone]          Signed: Sherrie George 10/22/2011, 5:07 PM

## 2011-10-22 NOTE — Anesthesia Procedure Notes (Signed)
Procedure Name: Intubation Date/Time: 10/22/2011 3:55 AM Performed by: Paris Lore Pre-anesthesia Checklist: Patient identified, Emergency Drugs available, Suction available, Patient being monitored and Timeout performed Patient Re-evaluated:Patient Re-evaluated prior to inductionOxygen Delivery Method: Circle system utilized Preoxygenation: Pre-oxygenation with 100% oxygen Intubation Type: IV induction, Rapid sequence and Cricoid Pressure applied Laryngoscope Size: Mac and 4 Grade View: Grade I Tube type: Oral Tube size: 7.5 mm Number of attempts: 1 Airway Equipment and Method: Stylet Placement Confirmation: ETT inserted through vocal cords under direct vision,  positive ETCO2,  CO2 detector and breath sounds checked- equal and bilateral Secured at: 21 cm Tube secured with: Tape Dental Injury: Teeth and Oropharynx as per pre-operative assessment

## 2011-10-22 NOTE — ED Notes (Signed)
Called [facility] to transport to [facility] ED

## 2011-10-22 NOTE — Discharge Summary (Signed)
I have seen and examined the patient and agree with the assessment and plans.  Kaylise Blakeley A. Jina Olenick  MD, FACS  

## 2011-10-22 NOTE — ED Notes (Signed)
Dr. Derrell Lolling explained procedure to patient.

## 2011-10-23 ENCOUNTER — Encounter (HOSPITAL_COMMUNITY): Payer: Self-pay | Admitting: General Surgery

## 2011-10-24 DIAGNOSIS — H9190 Unspecified hearing loss, unspecified ear: Secondary | ICD-10-CM | POA: Insufficient documentation

## 2011-10-24 NOTE — Anesthesia Postprocedure Evaluation (Signed)
  Anesthesia Post-op Note  Patient: [name] [name]  Procedure(s) Performed: Procedure(s) (LRB): APPENDECTOMY LAPAROSCOPIC (N/A)  Patient Location: PACU  Anesthesia Type: General  Level of Consciousness: awake and alert   Airway and Oxygen Therapy: Patient Spontanous Breathing  Post-op Pain: mild  Post-op Assessment: Post-op Vital signs reviewed, Patient's Cardiovascular Status Stable, Respiratory Function Stable, Patent Airway and No signs of Nausea or vomiting  Post-op Vital Signs: stable  Complications: No apparent anesthesia complications

## 2011-11-14 ENCOUNTER — Ambulatory Visit (INDEPENDENT_AMBULATORY_CARE_PROVIDER_SITE_OTHER): Payer: 59 | Admitting: General Surgery

## 2011-11-14 ENCOUNTER — Encounter (INDEPENDENT_AMBULATORY_CARE_PROVIDER_SITE_OTHER): Payer: Self-pay | Admitting: General Surgery

## 2011-11-14 VITALS — BP 116/80 | HR 64 | Temp 98.3°F | Resp 14 | Ht 62.5 in | Wt 168.0 lb

## 2011-11-14 DIAGNOSIS — K3580 Unspecified acute appendicitis: Secondary | ICD-10-CM

## 2011-11-14 NOTE — Progress Notes (Signed)
Subjective:     Patient ID: [name] [name], female   DOB: January 30, 1971, 41 y.o.   MRN: [id number]  HPI  This patient underwent laparoscopic appendectomy for histologically confirmed acute suppurative appendicitis on Oct 21, 2011. She has recovered and has returned to work and feels well. No alteration in GI habits. Almost no pain. Review of Systems     Objective:   Physical Exam Patient looks well. In no distress.  Abdomen: Soft. Flat. Nontender. All trocar sites well healed.    Assessment:     Acute suppurative appendicitis with focal abscess formation, and of recovery following laparoscopic appendectomy.    Plan:     Resume normal physical activities, no restriction.  Return to see me p.r.n.   Angelia Mould. Derrell Lolling, M.D., [facility] General and Minimally invasive Surgery Breast and Colorectal Surgery Office:   [phone] Pager:   [phone]

## 2011-11-14 NOTE — Patient Instructions (Signed)
You have recovered uneventfully from your laparoscopic appendectomy. Your  final pathology report showed acute appendicitis. No tumor was seen.  You may resume all normal physical activities without restriction.  Return to see Dr. Derrell Lolling if further problems arise.

## 2011-11-24 DIAGNOSIS — J45909 Unspecified asthma, uncomplicated: Secondary | ICD-10-CM | POA: Insufficient documentation

## 2012-02-09 DIAGNOSIS — H5589 Other irregular eye movements: Secondary | ICD-10-CM | POA: Insufficient documentation

## 2012-02-09 DIAGNOSIS — H53129 Transient visual loss, unspecified eye: Secondary | ICD-10-CM | POA: Insufficient documentation

## 2012-05-21 ENCOUNTER — Other Ambulatory Visit: Payer: Self-pay | Admitting: Obstetrics and Gynecology

## 2012-12-31 ENCOUNTER — Ambulatory Visit: Payer: 59 | Admitting: Psychology

## 2013-01-13 ENCOUNTER — Ambulatory Visit
Admission: RE | Admit: 2013-01-13 | Discharge: 2013-01-13 | Disposition: A | Discharge status: Home / Self Care | Payer: 59 | Source: Ambulatory Visit | Attending: Family Medicine | Admitting: Family Medicine

## 2013-01-13 ENCOUNTER — Other Ambulatory Visit: Payer: Self-pay | Admitting: Family Medicine

## 2013-01-13 DIAGNOSIS — R519 Headache, unspecified: Secondary | ICD-10-CM

## 2013-01-14 ENCOUNTER — Encounter (HOSPITAL_COMMUNITY): Payer: Self-pay | Admitting: *Deleted

## 2013-01-14 ENCOUNTER — Inpatient Hospital Stay (HOSPITAL_COMMUNITY)
Admission: EM | Admit: 2013-01-14 | Discharge: 2013-01-20 | DRG: 546 | Disposition: A | Discharge status: Home / Self Care | Payer: 59 | Attending: Internal Medicine | Admitting: Internal Medicine

## 2013-01-14 ENCOUNTER — Emergency Department (HOSPITAL_COMMUNITY): Payer: 59

## 2013-01-14 DIAGNOSIS — M359 Systemic involvement of connective tissue, unspecified: Secondary | ICD-10-CM | POA: Diagnosis present

## 2013-01-14 DIAGNOSIS — E871 Hypo-osmolality and hyponatremia: Secondary | ICD-10-CM

## 2013-01-14 DIAGNOSIS — R072 Precordial pain: Secondary | ICD-10-CM

## 2013-01-14 DIAGNOSIS — R509 Fever, unspecified: Secondary | ICD-10-CM

## 2013-01-14 DIAGNOSIS — E876 Hypokalemia: Secondary | ICD-10-CM | POA: Diagnosis present

## 2013-01-14 DIAGNOSIS — IMO0001 Reserved for inherently not codable concepts without codable children: Secondary | ICD-10-CM

## 2013-01-14 DIAGNOSIS — K219 Gastro-esophageal reflux disease without esophagitis: Secondary | ICD-10-CM | POA: Diagnosis present

## 2013-01-14 DIAGNOSIS — R7402 Elevation of levels of lactic acid dehydrogenase (LDH): Secondary | ICD-10-CM | POA: Diagnosis present

## 2013-01-14 DIAGNOSIS — R7401 Elevation of levels of liver transaminase levels: Secondary | ICD-10-CM | POA: Diagnosis present

## 2013-01-14 DIAGNOSIS — J320 Chronic maxillary sinusitis: Secondary | ICD-10-CM

## 2013-01-14 DIAGNOSIS — R05 Cough: Secondary | ICD-10-CM

## 2013-01-14 DIAGNOSIS — Z888 Allergy status to other drugs, medicaments and biological substances status: Secondary | ICD-10-CM

## 2013-01-14 DIAGNOSIS — J019 Acute sinusitis, unspecified: Secondary | ICD-10-CM | POA: Diagnosis present

## 2013-01-14 DIAGNOSIS — Z79899 Other long term (current) drug therapy: Secondary | ICD-10-CM

## 2013-01-14 DIAGNOSIS — J45901 Unspecified asthma with (acute) exacerbation: Secondary | ICD-10-CM | POA: Diagnosis present

## 2013-01-14 DIAGNOSIS — M3320 Polymyositis, organ involvement unspecified: Principal | ICD-10-CM | POA: Diagnosis present

## 2013-01-14 DIAGNOSIS — R059 Cough, unspecified: Secondary | ICD-10-CM

## 2013-01-14 LAB — BASIC METABOLIC PANEL
BUN: 10 mg/dL (ref 6–23)
CO2: 24 mEq/L (ref 19–32)
Calcium: 8.8 mg/dL (ref 8.4–10.5)
Chloride: 96 mEq/L (ref 96–112)
Creatinine, Ser: 0.89 mg/dL (ref 0.50–1.10)
GFR calc Af Amer: >90 mL/min (ref >90)
GFR calc non Af Amer: 79 mL/min — ABNORMAL LOW (ref >90)
Glucose, Bld: 116 mg/dL — ABNORMAL HIGH (ref 70–99)
Potassium: 3.4 mEq/L — ABNORMAL LOW (ref 3.5–5.1)
Sodium: 131 mEq/L — ABNORMAL LOW (ref 135–145)

## 2013-01-14 LAB — URINALYSIS, ROUTINE W REFLEX MICROSCOPIC
Bilirubin Urine: NEGATIVE
Glucose, UA: NEGATIVE mg/dL
Hgb urine dipstick: NEGATIVE
Ketones, ur: 15 mg/dL — AB
Leukocytes, UA: NEGATIVE
Nitrite: NEGATIVE
Protein, ur: 30 mg/dL — AB
Specific Gravity, Urine: 1.025 (ref 1.005–1.030)
Urobilinogen, UA: 0.2 mg/dL (ref 0.0–1.0)
pH: 6 (ref 5.0–8.0)

## 2013-01-14 LAB — URINE MICROSCOPIC-ADD ON

## 2013-01-14 LAB — CBC
HCT: 42.9 % (ref 36.0–46.0)
Hemoglobin: 14.4 g/dL (ref 12.0–15.0)
MCH: 28.8 pg (ref 26.0–34.0)
MCHC: 33.6 g/dL (ref 30.0–36.0)
MCV: 85.8 fL (ref 78.0–100.0)
Platelets: 179 10*3/uL (ref 150–400)
RBC: 5 MIL/uL (ref 3.87–5.11)
RDW: 12.5 % (ref 11.5–15.5)
WBC: 5.8 10*3/uL (ref 4.0–10.5)

## 2013-01-14 LAB — POCT PREGNANCY, URINE: Preg Test, Ur: NEGATIVE

## 2013-01-14 NOTE — ED Notes (Signed)
Per pt report: pt was seen at the ENT doctor's office and dx with chronic sinusitis.  Pt currently on antibiotics but has still been having a low grade fever.  Pt has episodes of SOB when pt coughs or is moving.  Pt denies a productive cough.  Pt advised to come to ED to be evaluated by MD. Pt last took 800mg  of advil at 20:00.  Pt has taken a total of 2400mg  today. Pt a/o x 4 and skin warm and dry.

## 2013-01-14 NOTE — ED Provider Notes (Signed)
CSN: [id number]     Arrival date & time 01/14/13  2122 History     First MD Initiated Contact with Patient 01/14/13 2147     Chief Complaint  Patient presents with  . Fever   (Consider location/radiation/quality/duration/timing/severity/associated sxs/prior Treatment) The history is provided by the patient and medical records. No language interpreter was used.    [name] [name] is a 42 y.o. female  with a hx of recurrent sinus infections presents to the Emergency Department complaining of gradual, persistent, progressively worsening fever for 2 weeks.  Patient states she sees Dr. Netty Starring at [facility] he treats her chronic sinusitis and has had several surgeries.  Patient obtained an MRI on Wednesday  which showed a possible acute sinusitis and patient was continued on clindamycin 900 mg by mouth daily.  the bike was begun 2 weeks ago at the onset of her fever.   Tonight patient has associated shortness of breath and cough for which she had to use her rescue inhaler. She states this did not really help her. Patient's been taking ibuprofen and Tylenol the last 2 weeks to control her fever. Today her fever measures 103.5.   She is concerned for further infection.  Patient has associated chills and headache without nuchal rigidity or neck stiffness. Patient denies chest pain, abdominal pain, nausea, vomiting, diarrhea, weakness, dizziness, syncope, dysuria, hematuria, rash.  Patient denies known tick bite; she has a dog at home but the dog lives outside.    WBC 5.9, SED rate 10, CMP normal.    Past Medical History  Diagnosis Date  . Chest pain     Dr. Riley Kill  . Sinusitis     Acute-on-chronic, remote history of exercise-induced asthma  . Pneumonia 2007    hospital X 2 weeks  . Drug allergy     multiple- FActine - "Nearly killed me" in 2007 at admission. was in ICU. -Rocephin-2007- tongue swelling- Zpak- Rash in 2010  . Arthritis   . Asthma   . GERD (gastroesophageal reflux disease)   . Heart  murmur    Past Surgical History  Procedure Laterality Date  . 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 Nasal sinus surgery  2008    X 4, Revision right total ethmoidectomy with right frontal recess expliration, left frontal recess exploration, bilateral sphenoidotomies  . Cystectomy      vaginal cyst  . Laparoscopic appendectomy  10/22/2011    Procedure: APPENDECTOMY LAPAROSCOPIC;  Surgeon: Ernestene Mention, MD;  Location: [facility] ORS;  Service: General;  Laterality: N/A;  . Appendectomy  10/22/11   Family History  Problem Relation Age of Onset  . Diabetes Father   . Hypertension Mother   . COPD Mother     smokes  . Hypertension Maternal Uncle   . Hypertension Maternal Grandmother   . Thyroid cancer Sister   . Cancer Sister     thyroid  . Cancer Paternal Grandfather     bladder   History  Substance Use Topics  . Smoking status: Never Smoker   . Smokeless tobacco: Never Used  . Alcohol Use: Yes     Comment: Rarely   OB History   Grav Para Term Preterm Abortions TAB SAB Ect Mult Living                 Review of Systems  Constitutional: Positive for fever, chills and fatigue. Negative for diaphoresis, appetite change and unexpected weight change.  HENT: Positive for congestion and sinus pressure. Negative for ear pain, sore throat, drooling, mouth sores, trouble swallowing, neck pain, neck stiffness and ear discharge.   Eyes: Negative for visual disturbance.  Respiratory: Positive for cough and shortness of breath. Negative for chest tightness and wheezing.   Cardiovascular: Negative for chest pain.  Gastrointestinal: Negative for nausea, vomiting, abdominal pain, diarrhea and constipation.  Endocrine: Negative for polydipsia, polyphagia and polyuria.  Genitourinary: Negative for dysuria, urgency, frequency and hematuria.  Musculoskeletal: Negative for back pain.  Skin: Negative for rash.  Allergic/Immunologic: Negative for immunocompromised state.   Neurological: Positive for headaches. Negative for syncope and light-headedness.  Hematological: Does not bruise/bleed easily.  Psychiatric/Behavioral: Negative for sleep disturbance. The patient is not nervous/anxious.     Allergies  Amoxicillin-pot clavulanate; Ceftriaxone sodium; Quinolones; Erythromycin; Morphine; Sulfa antibiotics; and Azithromycin  Home Medications   Current Outpatient Rx  Name  Route  Sig  Dispense  Refill  . albuterol (PROAIR HFA) 108 (90 BASE) MCG/ACT inhaler   Inhalation   Inhale 2 puffs into the lungs every 4 (four) hours as needed for wheezing or shortness of breath. For shortness of breath          . beclomethasone (QVAR) 80 MCG/ACT inhaler   Inhalation   Inhale 2 puffs into the lungs 2 (two) times daily.          . budesonide (PULMICORT) 1 MG/2ML nebulizer solution   Nasal   Place 1 mg into the nose daily. Pt uses this med as a nose drop, not a nebulizer.  1mg  per nare daily.         . clindamycin (CLEOCIN) 300 MG capsule   Oral   Take 900 mg by mouth 3 (three) times daily.         Marland Kitchen ibuprofen (ADVIL,MOTRIN) 800 MG tablet   Oral   Take 800 mg by mouth every 8 (eight) hours as needed for pain.         Marland Kitchen EPINEPHrine (EPI-PEN) 0.3 mg/0.3 mL SOAJ   Intramuscular   Inject 0.3 mg into the muscle once as needed (allergic reaction).         Marland Kitchen levonorgestrel (MIRENA) 20 MCG/24HR IUD   Intrauterine   1 each by Intrauterine route once. Inserted in 2011          BP 125/76  Pulse 93  Temp(Src) 99.1 F (37.3 C) (Rectal)  Resp 16  Ht 5' 2.75" (1.594 m)  Wt 168 lb (76.204 kg)  BMI 29.99 kg/m2  SpO2 95%  LMP 01/08/2013 Physical Exam  Nursing note and vitals reviewed. Constitutional: She is oriented to person, place, and time. She appears well-developed and well-nourished. No distress.  Awake, alert, nontoxic appearance  HENT:  Head: Normocephalic and atraumatic.  Right Ear: Tympanic membrane, external ear and ear canal normal.   Left Ear: Tympanic membrane, external ear and ear canal normal.  Nose: Mucosal edema and rhinorrhea present. No epistaxis. Right sinus exhibits frontal sinus tenderness. Right sinus exhibits no maxillary sinus tenderness. Left sinus exhibits frontal sinus tenderness. Left sinus exhibits no maxillary sinus tenderness.  Mouth/Throat: Uvula is midline, oropharynx is clear and moist and mucous membranes are normal. Mucous membranes are not pale and not cyanotic. No edematous. No oropharyngeal exudate, posterior oropharyngeal edema, posterior oropharyngeal erythema or tonsillar abscesses.  Eyes: Conjunctivae are normal. Pupils are equal, round, and reactive to light. No scleral icterus.  Neck: Normal range of motion and full passive range of motion without pain. Neck  supple. No spinous process tenderness and no muscular tenderness present. No rigidity. Normal range of motion present. No Brudzinski's sign and no Kernig's sign noted.  Cardiovascular: Normal rate, regular rhythm and intact distal pulses.   Pulmonary/Chest: Effort normal and breath sounds normal. No stridor. No respiratory distress. She has no wheezes.  Abdominal: Soft. Bowel sounds are normal. She exhibits no mass. There is no tenderness. There is no rebound and no guarding.  Musculoskeletal: Normal range of motion. She exhibits no edema.  Lymphadenopathy:    She has no cervical adenopathy.  Neurological: She is alert and oriented to person, place, and time. No cranial nerve deficit. She exhibits normal muscle tone. Coordination normal.  Speech is clear and goal oriented Moves extremities without ataxia  Skin: Skin is warm and dry. No rash noted. She is not diaphoretic.  Psychiatric: She has a normal mood and affect.    ED Course   Procedures (including critical care time)  Labs Reviewed  BASIC METABOLIC PANEL - Abnormal; Notable for the following:    Sodium 131 (*)    Potassium 3.4 (*)    Glucose, Bld 116 (*)    GFR calc non Af  Amer 79 (*)    All other components within normal limits  URINALYSIS, ROUTINE W REFLEX MICROSCOPIC - Abnormal; Notable for the following:    APPearance CLOUDY (*)    Ketones, ur 15 (*)    Protein, ur 30 (*)    All other components within normal limits  URINE MICROSCOPIC-ADD ON - Abnormal; Notable for the following:    Squamous Epithelial / LPF FEW (*)    All other components within normal limits  CG4 I-STAT (LACTIC ACID) - Abnormal; Notable for the following:    Lactic Acid, Venous 2.40 (*)    All other components within normal limits  CULTURE, BLOOD (ROUTINE X 2)  CULTURE, BLOOD (ROUTINE X 2)  CBC  B. BURGDORFI ANTIBODIES  ROCKY MTN SPOTTED FVR AB, IGG-BLOOD  ROCKY MTN SPOTTED FVR AB, IGM-BLOOD  EHRLICHIA ANTIBODY PANEL  POCT PREGNANCY, URINE   Dg Chest 2 View  01/15/2013   *RADIOLOGY REPORT*  Clinical Data: Shortness of breath.  CHEST - 2 VIEW  Comparison: 10/01/2011.  Findings: No significant osseous abnormality.  Lungs are clear. No effusion or pneumothorax.  Cardiomediastinal size and contour are within normal limits.  The upper abdomen is unremarkable.  IMPRESSION: No evidence of acute cardiopulmonary disease.   Original Report Authenticated By: Tiburcio Pea   Mr Brain Wo Contrast  01/13/2013   *RADIOLOGY REPORT*  Clinical Data: Headaches for 2 weeks.  Fever.  History multiple sinus surgeries.  MRI HEAD WITHOUT CONTRAST  Technique:  Multiplanar, multiecho pulse sequences of the brain and surrounding structures were obtained according to standard protocol without intravenous contrast.  Comparison: Sinus CT 08/19/2005.  Findings: Midline structures are within normal limits.  No acute infarct, hemorrhage, or mass lesion is present.  The ventricles are of normal size.  No significant extra-axial fluid collection is present.  Postoperative changes of bilateral nasal antrostomies and ethmoidectomies are again noted.  Circumferential mucosal thickening is present in both maxillary sinuses  with some fluid on the left.  Mild mucosal thickening is present throughout the residual ethmoid air cells and left frontal sinus.  The right frontal sinus is not pneumatized.  The sphenoid sinuses and mastoid air cells are clear.  IMPRESSION:  1.  Normal MRI appearance of the brain. 2.  Residual acute and chronic sinus disease as described despite  bilateral nasal antrostomies and partial ethmoidectomies.  There is some fluid in the left maxillary sinus.   Original Report Authenticated By: Marin Roberts, M.D.   1. Fever     MDM  Dorinda Hill presents with fever for several weeks.  No significant workups palpation and treatment for chronic sinusitis.  Tonight she developed fever of 103.5 and shortness of breath.  CBC without leukocytosis, urinalysis without evidence of urinary tract infection, BMP with mild hyponatremia 131 and mild hypokalemia at 3.4. Lactic acid 2.40.  Pt with MRI on 01/13/13 with Residual acute and chronic sinus disease despite bilateral nasal antrostomies and partial ethmoidectomies. There is  some fluid in the left maxillary sinus.  I personally reviewed the imaging tests through PACS system.  I reviewed available ER/hospitalization records through the EMR.    [facility] spotted fever, Lyme and Ehrlichia titer sent along with blood cultures.    Discussed with Dr. Doristine Counter of [name] who is patient's primary care Dr.  He states that when he saw her earlier in the week he checked some basic labs and results were as follows: WBC 5.9, SED rate 10, CMP normal.  He recommended that she follow back up if things worsen.   Discussed with Dr Lovell Sheehan who will admit and consult ID.      Dahlia Client Glorian Mcdonell, PA-C 01/15/13 [phone]

## 2013-01-15 ENCOUNTER — Encounter (HOSPITAL_COMMUNITY): Payer: Self-pay | Admitting: *Deleted

## 2013-01-15 DIAGNOSIS — J3489 Other specified disorders of nose and nasal sinuses: Secondary | ICD-10-CM

## 2013-01-15 DIAGNOSIS — J45901 Unspecified asthma with (acute) exacerbation: Secondary | ICD-10-CM

## 2013-01-15 DIAGNOSIS — R509 Fever, unspecified: Secondary | ICD-10-CM

## 2013-01-15 DIAGNOSIS — J320 Chronic maxillary sinusitis: Secondary | ICD-10-CM | POA: Diagnosis present

## 2013-01-15 DIAGNOSIS — E876 Hypokalemia: Secondary | ICD-10-CM | POA: Diagnosis present

## 2013-01-15 DIAGNOSIS — E871 Hypo-osmolality and hyponatremia: Secondary | ICD-10-CM

## 2013-01-15 LAB — BASIC METABOLIC PANEL
BUN: 8 mg/dL (ref 6–23)
CO2: 29 mEq/L (ref 19–32)
Calcium: 9 mg/dL (ref 8.4–10.5)
Chloride: 98 mEq/L (ref 96–112)
Creatinine, Ser: 0.95 mg/dL (ref 0.50–1.10)
GFR calc Af Amer: 85 mL/min — ABNORMAL LOW (ref >90)
GFR calc non Af Amer: 73 mL/min — ABNORMAL LOW (ref >90)
Glucose, Bld: 96 mg/dL (ref 70–99)
Potassium: 3.8 mEq/L (ref 3.5–5.1)
Sodium: 134 mEq/L — ABNORMAL LOW (ref 135–145)

## 2013-01-15 LAB — CG4 I-STAT (LACTIC ACID): Lactic Acid, Venous: 2.4 mmol/L — ABNORMAL HIGH (ref 0.5–2.2)

## 2013-01-15 LAB — CBC
HCT: 41.8 % (ref 36.0–46.0)
Hemoglobin: 14.2 g/dL (ref 12.0–15.0)
MCH: 29.2 pg (ref 26.0–34.0)
MCHC: 34 g/dL (ref 30.0–36.0)
MCV: 86 fL (ref 78.0–100.0)
Platelets: 166 10*3/uL (ref 150–400)
RBC: 4.86 MIL/uL (ref 3.87–5.11)
RDW: 12.6 % (ref 11.5–15.5)
WBC: 5.7 10*3/uL (ref 4.0–10.5)

## 2013-01-15 MED ORDER — DIPHENHYDRAMINE HCL 50 MG/ML IJ SOLN
INTRAMUSCULAR | Status: AC
  Filled 2013-01-15: qty 1

## 2013-01-15 MED ORDER — VANCOMYCIN HCL IN DEXTROSE 750-5 MG/150ML-% IV SOLN
750.0000 mg | Freq: Three times a day (TID) | INTRAVENOUS | Status: DC
  Administered 2013-01-15: 750 mg via INTRAVENOUS
  Filled 2013-01-15 (×2): qty 150

## 2013-01-15 MED ORDER — DIPHENHYDRAMINE HCL 50 MG/ML IJ SOLN
25.0000 mg | Freq: Four times a day (QID) | INTRAMUSCULAR | Status: DC | PRN
  Administered 2013-01-15: 12.5 mg via INTRAVENOUS
  Filled 2013-01-15: qty 1

## 2013-01-15 MED ORDER — HYDROMORPHONE HCL PF 1 MG/ML IJ SOLN
0.5000 mg | INTRAMUSCULAR | Status: DC | PRN
  Administered 2013-01-17: 1 mg via INTRAVENOUS
  Administered 2013-01-18: 0.5 mg via INTRAVENOUS
  Administered 2013-01-18: 1 mg via INTRAVENOUS
  Filled 2013-01-15 (×3): qty 1

## 2013-01-15 MED ORDER — DIPHENHYDRAMINE HCL 50 MG/ML IJ SOLN
12.5000 mg | Freq: Three times a day (TID) | INTRAMUSCULAR | Status: DC | PRN
  Administered 2013-01-15: 12.5 mg via INTRAVENOUS

## 2013-01-15 MED ORDER — ONDANSETRON HCL 4 MG/2ML IJ SOLN: 4.0000 mg | Freq: Three times a day (TID) | INTRAMUSCULAR | Status: DC | PRN

## 2013-01-15 MED ORDER — ACETAMINOPHEN 650 MG RE SUPP: 650.0000 mg | Freq: Four times a day (QID) | RECTAL | Status: DC | PRN

## 2013-01-15 MED ORDER — POTASSIUM CHLORIDE CRYS ER 20 MEQ PO TBCR
40.0000 meq | EXTENDED_RELEASE_TABLET | ORAL | Status: DC
  Administered 2013-01-15: 40 meq via ORAL
  Filled 2013-01-15 (×2): qty 2

## 2013-01-15 MED ORDER — ONDANSETRON HCL 4 MG/2ML IJ SOLN
4.0000 mg | Freq: Four times a day (QID) | INTRAMUSCULAR | Status: DC | PRN
  Administered 2013-01-16: 4 mg via INTRAVENOUS
  Filled 2013-01-15: qty 2

## 2013-01-15 MED ORDER — DIPHENHYDRAMINE HCL 50 MG/ML IJ SOLN
25.0000 mg | Freq: Three times a day (TID) | INTRAMUSCULAR | Status: DC
  Administered 2013-01-15: 25 mg via INTRAVENOUS
  Administered 2013-01-16: 12:00:00 via INTRAVENOUS
  Administered 2013-01-16 – 2013-01-19 (×8): 25 mg via INTRAVENOUS
  Filled 2013-01-15: qty 1
  Filled 2013-01-15 (×2): qty 0.5
  Filled 2013-01-15: qty 1
  Filled 2013-01-15: qty 0.5
  Filled 2013-01-15: qty 1
  Filled 2013-01-15 (×2): qty 0.5
  Filled 2013-01-15: qty 1
  Filled 2013-01-15: qty 0.5
  Filled 2013-01-15 (×3): qty 1
  Filled 2013-01-15 (×2): qty 0.5
  Filled 2013-01-15: qty 1
  Filled 2013-01-15 (×2): qty 0.5
  Filled 2013-01-15 (×2): qty 1
  Filled 2013-01-15 (×2): qty 0.5

## 2013-01-15 MED ORDER — SODIUM CHLORIDE 0.9 % IV SOLN
INTRAVENOUS | Status: DC
  Administered 2013-01-15 – 2013-01-17 (×2): via INTRAVENOUS

## 2013-01-15 MED ORDER — TRAMADOL HCL 50 MG PO TABS
50.0000 mg | ORAL_TABLET | Freq: Four times a day (QID) | ORAL | Status: DC | PRN
  Administered 2013-01-15 – 2013-01-19 (×5): 50 mg via ORAL
  Filled 2013-01-15 (×7): qty 1

## 2013-01-15 MED ORDER — HYDROMORPHONE HCL PF 1 MG/ML IJ SOLN: 1.0000 mg | INTRAMUSCULAR | Status: DC | PRN

## 2013-01-15 MED ORDER — ALUM & MAG HYDROXIDE-SIMETH 200-200-20 MG/5ML PO SUSP: 30.0000 mL | Freq: Four times a day (QID) | ORAL | Status: DC | PRN

## 2013-01-15 MED ORDER — ACETAMINOPHEN 325 MG PO TABS
650.0000 mg | ORAL_TABLET | Freq: Four times a day (QID) | ORAL | Status: DC | PRN
  Administered 2013-01-15 – 2013-01-19 (×11): 650 mg via ORAL
  Filled 2013-01-15 (×12): qty 2

## 2013-01-15 MED ORDER — ZOLPIDEM TARTRATE 5 MG PO TABS
5.0000 mg | ORAL_TABLET | Freq: Every evening | ORAL | Status: DC | PRN
  Administered 2013-01-19: 5 mg via ORAL
  Filled 2013-01-15: qty 1

## 2013-01-15 MED ORDER — BUDESONIDE 0.25 MG/2ML IN SUSP
0.2500 mg | Freq: Two times a day (BID) | RESPIRATORY_TRACT | Status: DC
  Administered 2013-01-15 – 2013-01-17 (×5): 0.25 mg via RESPIRATORY_TRACT
  Filled 2013-01-15 (×7): qty 2

## 2013-01-15 MED ORDER — ONDANSETRON HCL 4 MG PO TABS: 4.0000 mg | ORAL_TABLET | Freq: Four times a day (QID) | ORAL | Status: DC | PRN

## 2013-01-15 MED ORDER — ENOXAPARIN SODIUM 40 MG/0.4ML ~~LOC~~ SOLN
40.0000 mg | SUBCUTANEOUS | Status: DC
  Administered 2013-01-15: 40 mg via SUBCUTANEOUS
  Filled 2013-01-15: qty 0.4

## 2013-01-15 MED ORDER — VANCOMYCIN HCL IN DEXTROSE 750-5 MG/150ML-% IV SOLN
750.0000 mg | Freq: Three times a day (TID) | INTRAVENOUS | Status: DC
  Administered 2013-01-15 – 2013-01-19 (×11): 750 mg via INTRAVENOUS
  Filled 2013-01-15 (×14): qty 150

## 2013-01-15 NOTE — Progress Notes (Signed)
Vancomycin reaction during infusion (90% completed) Red rash to chest, scattered hives to forehead with pruritis.  Vital signs stable. On call notified. Benadryl 25 mg adm with relief.  Pharmacies recommendation: premedicate with benadryl, infuse vancomycin over 2 hours

## 2013-01-15 NOTE — ED Notes (Signed)
Report has been called.  Dr. Lovell Sheehan is at bedside assessing pt and requesting transport to floor after her assessment.

## 2013-01-15 NOTE — ED Provider Notes (Signed)
Medical screening examination/treatment/procedure(s) were performed by non-physician practitioner and as supervising physician I was immediately available for consultation/collaboration.  Deepika Decatur L Kingslee Dowse, MD 01/15/13 0626 

## 2013-01-15 NOTE — Progress Notes (Signed)
Patient seen and examined. Admitted after midnight secondary to HA's, fever and increase worsening in her breathing. Patient with hx of asthma, chronic sinusitis and recently started on clindamycin for presumed acute on chronic sinusitis and has not had significant improvements in her symptoms especially fever, Ha's and neck pain (no rigidity). Please referred to Dr. York Ram for further details/info on admission.  Plan: -will continue empiric tx with vancomycin for now -ID service has been consulted (Dr. Luciana Axe); at this moment his initial rec's is for LP to r/o aseptic meningitis. He will be seen the patient later today for further recommendations -will replete electrolytes and continue supportive care -IR and CCM procedure team contacted for LP  Nyshawn Gowdy [phone]

## 2013-01-15 NOTE — Consult Note (Addendum)
[facility]     Reason for Consult: acute on chronic sinusitis and multiple antibiotic allergies    Referring Physician: Dr. Gwenlyn Perking  Principal Problem:   Fever Active Problems:   ASTHMA UNSPECIFIED WITH EXACERBATION   Chronic maxillary sinusitis   Hyponatremia   Hypokalemia   . budesonide (PULMICORT) nebulizer solution  0.25 mg Nebulization BID  . diphenhydrAMINE  25 mg Intravenous Q8H  . vancomycin  750 mg Intravenous Q8H    Recommendations: Continue with vancomycin for now I agree with doing LP and patient agreeable to r/o meningitis (does not need isolation) If negative for meningitis, she may just need IV antibiotics through picc line for a week or so and ENT follow up   Assessment: She has had recurrent sinus issues and allergies and has not improved with clindamycin.  Afebrile now.    Antibiotics: Vancomycin day 1   HPI: [name] [name] is a 42 y.o. female with a history of significant sinus disease and multiple antibiotic allergies who initially felt poor about 1 week ago with fever, malaise and headache, somewhat atypical to her usual sinus problem.  She was started on clindamycin by her ENT at [facility] and did not get much better, rechecking her temperature again and was up to 102.  She called her PCP and got an MRI which was reassuring but came in to ED after talking to her PCP for evaluation.  She has complained of neck pain, photophobia and bilateral headache.  No tick exposure prior to event.  No rashes.  RMSF and erlichia pending, certainly possible.     Review of Systems: A comprehensive review of systems was negative.  Past Medical History  Diagnosis Date  . Chest pain     Dr. Riley Kill  . Sinusitis     Acute-on-chronic, remote history of exercise-induced asthma  . Pneumonia 2007    hospital X 2 weeks  . Drug allergy     multiple- FActine - "Nearly killed me" in 2007 at admission. was in ICU. -Rocephin-2007- tongue swelling-  Zpak- Rash in 2010  . Arthritis   . Asthma   . GERD (gastroesophageal reflux disease)   . Heart murmur     History  Substance Use Topics  . Smoking status: Never Smoker   . Smokeless tobacco: Never Used  . Alcohol Use: Yes     Comment: Rarely    Family History  Problem Relation Age of Onset  . Diabetes Father   . Hypertension Mother   . COPD Mother     smokes  . Hypertension Maternal Uncle   . Hypertension Maternal Grandmother   . Thyroid cancer Sister   . Cancer Sister     thyroid  . Cancer Paternal Grandfather     bladder   Allergies  Allergen Reactions  . Amoxicillin-Pot Clavulanate Anaphylaxis  . Ceftriaxone Sodium Anaphylaxis  . Quinolones Anaphylaxis  . Erythromycin Diarrhea and Nausea And Vomiting  . Morphine Itching  . Sulfa Antibiotics Hives  . Azithromycin Hives and Rash    OBJECTIVE: Blood pressure 107/57, pulse 79, temperature 99.8 F (37.7 C), temperature source Oral, resp. rate 18, height 5\' 2"  (1.575 m), weight 168 lb (76.204 kg), last menstrual period 01/08/2013, SpO2 97.00%. General: Awake, alert, nad Skin: no rashes Lungs: CTA B Cor: RRR without m/r/g Abdomen: soft, nt, nd Neuro: no meningismus but pain with neck flexion  Microbiology: No results found for this or any previous visit (from the past 240 hour(s)).  Staci Righter, MD [facility] [email or web address] [phone] pager  [phone] cell 01/15/2013, 4:49 PM

## 2013-01-15 NOTE — Progress Notes (Signed)
ANTIBIOTIC CONSULT NOTE - INITIAL  Pharmacy Consult for Vancomycin Indication: Fever in setting of chronic sinusitis  Allergies  Allergen Reactions  . Amoxicillin-Pot Clavulanate Anaphylaxis  . Ceftriaxone Sodium Anaphylaxis  . Quinolones Anaphylaxis  . Erythromycin Diarrhea and Nausea And Vomiting  . Morphine Itching  . Sulfa Antibiotics Hives  . Azithromycin Hives and Rash    Patient Measurements: Height: 5' 2.75" (159.4 cm) Weight: 168 lb (76.204 kg) IBW/kg (Calculated) : 51.83  Vital Signs: Temp: 98.7 F (37.1 C) (08/02 0158) Temp src: Oral (08/02 0158) BP: 123/71 mmHg (08/02 0158) Pulse Rate: 64 (08/02 0158) Intake/Output from previous day:   Intake/Output from this shift:    Labs:  Recent Labs  01/14/13 2155  WBC 5.8  HGB 14.4  PLT 179  CREATININE 0.89   Estimated Creatinine Clearance: 80.9 ml/min (by C-G formula based on Cr of 0.89). No results found for this basename: VANCOTROUGH, VANCOPEAK, VANCORANDOM, GENTTROUGH, GENTPEAK, GENTRANDOM, TOBRATROUGH, TOBRAPEAK, TOBRARND, AMIKACINPEAK, AMIKACINTROU, AMIKACIN,  in the last 72 hours   Microbiology: No results found for this or any previous visit (from the past 720 hour(s)).  Medical History: Past Medical History  Diagnosis Date  . Chest pain     Dr. Riley Kill  . Sinusitis     Acute-on-chronic, remote history of exercise-induced asthma  . Pneumonia 2007    hospital X 2 weeks  . Drug allergy     multiple- FActine - "Nearly killed me" in 2007 at admission. was in ICU. -Rocephin-2007- tongue swelling- Zpak- Rash in 2010  . Arthritis   . Asthma   . GERD (gastroesophageal reflux disease)   . Heart murmur     Medications:  Scheduled:  . enoxaparin (LOVENOX) injection  40 mg Subcutaneous Q24H   Infusions:  . sodium chloride     Assessment:  42 yr old female with fever, chills and headache.  H/O chronic sinusitis and patient has been on Clindamycin 900mg  TID x 2 weeks.  CrCl (CG) = 81  ml/min  Plan for ID consult  Goal of Therapy:  Vancomycin trough level 15-20 mcg/ml  Plan:  Measure antibiotic drug levels at steady state Follow up culture results Vancomycin 750mg  IV q8h  Maryellen Pile, PharmD 01/15/2013,2:08 AM

## 2013-01-15 NOTE — Progress Notes (Signed)
This RN went to access pt's IV after vancomycin dose, and IV was painful with flushes. This RN accessed pt's veins and did not see any possible veins to access. Per Dr. Ephriam Knuckles note, this RN called Dr. Gwenlyn Perking to obtain order for PICC line. IV team was called and 1 attempt was made with no new access obtained. IV team is aware of the need for a PICC line. Dr. Gwenlyn Perking was called and is aware that pt has no IV access until PICC line insertion. No oral antibiotic orders received. Tonight's dose of vancomycin will be held until IV access can be regained due to pt's multiple antibiotic allergies. Eugene Garnet RN

## 2013-01-15 NOTE — H&P (Signed)
[facility] History and Physical  [name] [name]:[id number] DOB: 10-17-1970 DOA: 01/14/2013  Referring physician:  EDP PCP: Delorse Lek, MD  Specialists:   Chief Complaint:  Fever  HPI: TIFFINE HENIGAN is a 42 y.o. female with a history of recurrent Sinus infections and sinus Surgery X5 who presents to the ED with complaints of continued fevers and chills that worsened today despite 1 week of clindamycin therapy.   She reports having a higher fever today to 103, and she reports beginning to have a cough and SOB and chest tightness.  She had to use her rescue inhaler at home PTA.   She was evaluated in the ED and a Chest X-ray was performed and blood cultures were sent and she was referred for medical admission.     Review of Systems: The patient denies anorexia, headaches, weight loss, vision loss, diplopia, dizziness, decreased hearing, rhinitis, hoarseness, chest pain, syncope, dyspnea on exertion, peripheral edema, balance deficits, cough, hemoptysis, abdominal pain, nausea, vomiting, diarrhea, constipation, hematemesis, melena, hematochezia, severe indigestion/heartburn, dysuria, hematuria, incontinence, muscle weakness, suspicious skin lesions, transient blindness, difficulty walking, depression, unusual weight change, abnormal bleeding, enlarged lymph nodes, angioedema, and breast masses.    Past Medical History  Diagnosis Date  . Chest pain     Dr. Riley Kill  . Sinusitis     Acute-on-chronic, remote history of exercise-induced asthma  . Pneumonia 2007    hospital X 2 weeks  . Drug allergy     multiple- FActine - "Nearly killed me" in 2007 at admission. was in ICU. -Rocephin-2007- tongue swelling- Zpak- Rash in 2010  . Arthritis   . Asthma   . GERD (gastroesophageal reflux disease)   . Heart murmur     Past Surgical History  Procedure Laterality Date  . 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 Nasal sinus surgery  2008    X 4, Revision right  total ethmoidectomy with right frontal recess expliration, left frontal recess exploration, bilateral sphenoidotomies  . Cystectomy      vaginal cyst  . Laparoscopic appendectomy  10/22/2011    Procedure: APPENDECTOMY LAPAROSCOPIC;  Surgeon: Ernestene Mention, MD;  Location: [facility];  Service: General;  Laterality: N/A;  . Appendectomy  10/22/11    Prior to Admission medications   Medication Sig Start Date End Date Taking? Authorizing Provider  albuterol (PROAIR HFA) 108 (90 BASE) MCG/ACT inhaler Inhale 2 puffs into the lungs every 4 (four) hours as needed for wheezing or shortness of breath. For shortness of breath    Yes Historical Provider, MD  beclomethasone (QVAR) 80 MCG/ACT inhaler Inhale 2 puffs into the lungs 2 (two) times daily.    Yes Historical Provider, MD  budesonide (PULMICORT) 1 MG/2ML nebulizer solution Place 1 mg into the nose daily. Pt uses this med as a nose drop, not a nebulizer.  1mg  per nare daily.   Yes Historical Provider, MD  clindamycin (CLEOCIN) 300 MG capsule Take 900 mg by mouth 3 (three) times daily. 01/07/13 01/21/13 Yes Historical Provider, MD  ibuprofen (ADVIL,MOTRIN) 800 MG tablet Take 800 mg by mouth every 8 (eight) hours as needed for pain.   Yes Historical Provider, MD  EPINEPHrine (EPI-PEN) 0.3 mg/0.3 mL SOAJ Inject 0.3 mg into the muscle once as needed (allergic reaction).    Historical Provider, MD  levonorgestrel (MIRENA) 20 MCG/24HR IUD 1 each by Intrauterine route once. Inserted in 2011    Historical Provider, MD  Allergies  Allergen Reactions  . Amoxicillin-Pot Clavulanate Anaphylaxis  . Ceftriaxone Sodium Anaphylaxis  . Quinolones Anaphylaxis  . Erythromycin Diarrhea and Nausea And Vomiting  . Morphine Itching  . Sulfa Antibiotics Hives  . Azithromycin Hives and Rash    Social History:  reports that she has never smoked. She has never used smokeless tobacco. She reports that  drinks alcohol. She reports that she does not use illicit drugs.      Family History  Problem Relation Age of Onset  . Diabetes Father   . Hypertension Mother   . COPD Mother     smokes  . Hypertension Maternal Uncle   . Hypertension Maternal Grandmother   . Thyroid cancer Sister   . Cancer Sister     thyroid  . Cancer Paternal Grandfather     bladder    (be sure to complete)   Physical Exam:  GEN: Pleasant Well Nourished and well developed  42 y.o. Caucasian  female  examined  and in no acute distress; cooperative with exam Filed Vitals:   01/15/13 0100 01/15/13 0158 01/15/13 0245 01/15/13 0429  BP:  123/71 121/76 108/70  Pulse:  64 78 80  Temp: 99.1 F (37.3 C) 98.7 F (37.1 C) 98.9 F (37.2 C) 98.8 F (37.1 C)  TempSrc: Rectal Oral Oral Oral  Resp:  18    Height:   5\' 2"  (1.575 m)   Weight:   76.204 kg (168 lb)   SpO2:   98% 99%   Blood pressure 108/70, pulse 80, temperature 98.8 F (37.1 C), temperature source Oral, resp. rate 18, height 5\' 2"  (1.575 m), weight 76.204 kg (168 lb), last menstrual period 01/08/2013, SpO2 99.00%. PSYCH: She is alert and oriented x4; does not appear anxious does not appear depressed; affect is normal HEENT: Normocephalic and Atraumatic, Mucous membranes pink; PERRLA; EOM intact; Fundi:  Benign;  No scleral icterus, Nares: Patent, Oropharynx: Clear, Fair Dentition;  Neck:  FROM, no cervical lymphadenopathy nor thyromegaly or carotid bruit; no JVD; Breasts:: Not examined CHEST WALL: No tenderness CHEST: Normal respiration, clear to auscultation bilaterally HEART: Regular rate and rhythm; no murmurs rubs or gallops BACK: No kyphosis or scoliosis; no CVA tenderness ABDOMEN: Positive Bowel Sounds, soft non-tender; no masses, no organomegaly. Rectal Exam: Not done EXTREMITIES: No cyanosis, clubbing or edema; no ulcerations. Genitalia: not examined PULSES: 2+ and symmetric SKIN: Normal hydration no rash or ulceration CNS: Cranial nerves 2-12 grossly intact no focal neurologic deficit    Labs on  Admission:  Basic Metabolic Panel:  Recent Labs Lab 01/14/13 2155  NA 131*  K 3.4*  CL 96  CO2 24  GLUCOSE 116*  BUN 10  CREATININE 0.89  CALCIUM 8.8   Liver Function Tests: No results found for this basename: AST, ALT, ALKPHOS, BILITOT, PROT, ALBUMIN,  in the last 168 hours No results found for this basename: LIPASE, AMYLASE,  in the last 168 hours No results found for this basename: AMMONIA,  in the last 168 hours CBC:  Recent Labs Lab 01/14/13 2155  WBC 5.8  HGB 14.4  HCT 42.9  MCV 85.8  PLT 179   Cardiac Enzymes: No results found for this basename: CKTOTAL, CKMB, CKMBINDEX, TROPONINI,  in the last 168 hours  BNP (last 3 results) No results found for this basename: PROBNP,  in the last 8760 hours CBG: No results found for this basename: GLUCAP,  in the last 168 hours  Radiological Exams on Admission: Dg Chest 2 View  01/15/2013   *  RADIOLOGY REPORT*  Clinical Data: Shortness of breath.  CHEST - 2 VIEW  Comparison: 10/01/2011.  Findings: No significant osseous abnormality.  Lungs are clear. No effusion or pneumothorax.  Cardiomediastinal size and contour are within normal limits.  The upper abdomen is unremarkable.  IMPRESSION: No evidence of acute cardiopulmonary disease.   Original Report Authenticated By: Tiburcio Pea   Mr Brain Wo Contrast  01/13/2013   *RADIOLOGY REPORT*  Clinical Data: Headaches for 2 weeks.  Fever.  History multiple sinus surgeries.  MRI HEAD WITHOUT CONTRAST  Technique:  Multiplanar, multiecho pulse sequences of the brain and surrounding structures were obtained according to standard protocol without intravenous contrast.  Comparison: Sinus CT 08/19/2005.  Findings: Midline structures are within normal limits.  No acute infarct, hemorrhage, or mass lesion is present.  The ventricles are of normal size.  No significant extra-axial fluid collection is present.  Postoperative changes of bilateral nasal antrostomies and ethmoidectomies are again  noted.  Circumferential mucosal thickening is present in both maxillary sinuses with some fluid on the left.  Mild mucosal thickening is present throughout the residual ethmoid air cells and left frontal sinus.  The right frontal sinus is not pneumatized.  The sphenoid sinuses and mastoid air cells are clear.  IMPRESSION:  1.  Normal MRI appearance of the brain. 2.  Residual acute and chronic sinus disease as described despite bilateral nasal antrostomies and partial ethmoidectomies.  There is some fluid in the left maxillary sinus.   Original Report Authenticated By: Marin Roberts, M.D.      Assessment/Plan Principal Problem:   Fever Active Problems:   ASTHMA UNSPECIFIED WITH EXACERBATION   Chronic maxillary sinusitis   Hyponatremia   Hypokalemia  1.  Fever-  Sepsis Versus SIRS versus Drug Fever,   Blood Cultures sent, and placed on IV Vancomycin empirically.   May need and ID Consutlation for fever workup and medication.    2.  Asthma without Exacaerbation -  Albuterol Nebs q 6 hrs PRN.    3.  Chronic Maxillary sinusitis - on Outpt MRI 1 day ago.   Had taken 1 week of a 2 week course of Clindamycin.     4.  Hyponatremia-  IVFs  With NSS, monitor trend.     5.  Hypokalemia- Replete K+.    6.   DVT prophylaxis with Lovenox.        Code Status:      FULL CODE Family Communication:   Husband at Bedside Disposition Plan:       Return to Home  Time spent:  60 minutes  Ron Parker [facility] Pager [phone]  If 7PM-7AM, please contact night-coverage [email or web address] Password [facility] 01/15/2013, 4:43 AM

## 2013-01-15 NOTE — ED Notes (Signed)
I-stat CG4 given to Dr. Effie Shy

## 2013-01-16 ENCOUNTER — Inpatient Hospital Stay (HOSPITAL_COMMUNITY): Payer: 59

## 2013-01-16 DIAGNOSIS — R509 Fever, unspecified: Secondary | ICD-10-CM

## 2013-01-16 LAB — CBC
HCT: 40 % (ref 36.0–46.0)
Hemoglobin: 13.3 g/dL (ref 12.0–15.0)
MCH: 28.8 pg (ref 26.0–34.0)
MCHC: 33.3 g/dL (ref 30.0–36.0)
MCV: 86.6 fL (ref 78.0–100.0)
Platelets: 175 10*3/uL (ref 150–400)
RBC: 4.62 MIL/uL (ref 3.87–5.11)
RDW: 12.5 % (ref 11.5–15.5)
WBC: 5.1 10*3/uL (ref 4.0–10.5)

## 2013-01-16 LAB — BASIC METABOLIC PANEL
BUN: 6 mg/dL (ref 6–23)
CO2: 31 mEq/L (ref 19–32)
Calcium: 9 mg/dL (ref 8.4–10.5)
Chloride: 97 mEq/L (ref 96–112)
Creatinine, Ser: 0.82 mg/dL (ref 0.50–1.10)
GFR calc Af Amer: >90 mL/min (ref >90)
GFR calc non Af Amer: 88 mL/min — ABNORMAL LOW (ref >90)
Glucose, Bld: 113 mg/dL — ABNORMAL HIGH (ref 70–99)
Potassium: 3.9 mEq/L (ref 3.5–5.1)
Sodium: 132 mEq/L — ABNORMAL LOW (ref 135–145)

## 2013-01-16 LAB — CSF CELL COUNT WITH DIFFERENTIAL
RBC Count, CSF: 29 /mm3 — ABNORMAL HIGH
Tube #: 4
WBC, CSF: 0 /mm3 (ref 0–5)

## 2013-01-16 LAB — PROTEIN AND GLUCOSE, CSF
Glucose, CSF: 62 mg/dL (ref 43–76)
Total  Protein, CSF: 21 mg/dL (ref 15–45)

## 2013-01-16 LAB — GRAM STAIN
Gram Stain: NONE SEEN
Special Requests: NORMAL

## 2013-01-16 MED ORDER — METHOCARBAMOL 500 MG PO TABS
500.0000 mg | ORAL_TABLET | Freq: Three times a day (TID) | ORAL | Status: DC | PRN
  Administered 2013-01-16 – 2013-01-17 (×2): 500 mg via ORAL
  Filled 2013-01-16 (×2): qty 1

## 2013-01-16 MED ORDER — SODIUM CHLORIDE 0.9 % IJ SOLN
10.0000 mL | INTRAMUSCULAR | Status: DC | PRN
  Administered 2013-01-17 – 2013-01-19 (×5): 10 mL

## 2013-01-16 NOTE — Plan of Care (Signed)
Problem: Phase II Progression Outcomes Goal: Vital signs remain stable Outcome: Progressing Temp-101.5, 2 Tylenol given temp-99

## 2013-01-16 NOTE — Progress Notes (Signed)
Peripherally Inserted Central Catheter/Midline Placement  The IV Nurse has discussed with the patient and/or persons authorized to consent for the patient, the purpose of this procedure and the potential benefits and risks involved with this procedure.  The benefits include less needle sticks, lab draws from the catheter and patient may be discharged home with the catheter.  Risks include, but not limited to, infection, bleeding, blood clot (thrombus formation), and puncture of an artery; nerve damage and irregular heat beat.  Alternatives to this procedure were also discussed.  PICC/Midline Placement Documentation        [name] [name] 01/16/2013, 10:20 AM

## 2013-01-16 NOTE — Progress Notes (Addendum)
Pt. Returned from LP under Fluroscopy instructed to lay flat x 4 hours. Temp-99, orally. Ice applied to back of neck for headache. May sit up after 1900. Visitor at bedside. Pt resting.Around 1600 pt c/o of pain in her Rt. Calf rated it 10/10. It didn't feel warm or look red. Dr. Gwenlyn Perking called Orders given. Will call back if it doesn't improve. Zofran given for nausea. States it's better

## 2013-01-16 NOTE — Progress Notes (Signed)
[facility] PROGRESS NOTE  [name] [name] ZOX:[id number] DOB: 10/06/1970 DOA: 01/14/2013 PCP: Delorse Lek, MD  Assessment/Plan: 1-Fever: patient with hx of chronic sinusitis with recurrent infections. Came to hospital complaining of worsening HA and ongoing fever despite outpatient use of clindamycin. -ID has been consulted and has recommended to continue vancomycin and to get LP -IR to perform LP today (orders for body fluid analysis in place) -continue supportive care and follow clinical response -will use robaxin for neck discomfort (appears to be muscular on exam) -blood cx's w/o growth -erlichia, RMSF, lyme antibodies pending.  2-hx of asthma: continue pulmicort  3-Hyponatremia and hypokalemia: resolved with IVf's and repletion  4-Chronic sinusitis: ID has recommended to continue vanc and treatment will be for 7-10 days -follow up with ENT as an outpatient  DVT: SCD's  Code Status: Full Family Communication: mother and best friend at bedside Disposition Plan: continue abx's, follow LP results; follow ID recommendations   Consultants:  IR  ID  PCCM (for LP procedure)  Procedures:  PICC line place  LP pending  Antibiotics:  vancomycin  HPI/Subjective: Temp max 101 around mid-morning; still complaining of HA's and neck discomfort  Objective: Filed Vitals:   01/15/13 2200 01/16/13 0200 01/16/13 0600 01/16/13 0859  BP: 95/55 131/73 111/72   Pulse: 83 89 86   Temp: 99.2 F (37.3 C) 99.5 F (37.5 C) 99.8 F (37.7 C)   TempSrc: Oral Oral Oral   Resp: 18 20 20    Height:      Weight:      SpO2: 93% 98% 97% 98%    Intake/Output Summary (Last 24 hours) at 01/16/13 1310 Last data filed at 01/15/13 1511  Gross per 24 hour  Intake   1190 ml  Output      0 ml  Net   1190 ml   Filed Weights   01/14/13 2130 01/15/13 0245  Weight: 76.204 kg (168 lb) 76.204 kg (168 lb)    Exam:   General:  NAD, AAOx3, complaining of HA's and discomfort base of  her neck  Cardiovascular: S1 and S2, no rubs or gallops  Respiratory: CTA bilaterally  Abdomen: soft, NT, ND, positive BS  Musculoskeletal: no edema, no cyanosis  Neuro: no focal deficit  Data Reviewed: Basic Metabolic Panel:  Recent Labs Lab 01/14/13 2155 01/15/13 0542 01/16/13 0618  NA 131* 134* 132*  K 3.4* 3.8 3.9  CL 96 98 97  CO2 24 29 31   GLUCOSE 116* 96 113*  BUN 10 8 6   CREATININE 0.89 0.95 0.82  CALCIUM 8.8 9.0 9.0   CBC:  Recent Labs Lab 01/14/13 2155 01/15/13 0542 01/16/13 0618  WBC 5.8 5.7 5.1  HGB 14.4 14.2 13.3  HCT 42.9 41.8 40.0  MCV 85.8 86.0 86.6  PLT 179 166 175    Studies: Dg Chest 2 View  01/15/2013   *RADIOLOGY REPORT*  Clinical Data: Shortness of breath.  CHEST - 2 VIEW  Comparison: 10/01/2011.  Findings: No significant osseous abnormality.  Lungs are clear. No effusion or pneumothorax.  Cardiomediastinal size and contour are within normal limits.  The upper abdomen is unremarkable.  IMPRESSION: No evidence of acute cardiopulmonary disease.   Original Report Authenticated By: Tiburcio Pea    Scheduled Meds: . budesonide (PULMICORT) nebulizer solution  0.25 mg Nebulization BID  . diphenhydrAMINE  25 mg Intravenous Q8H  . vancomycin  750 mg Intravenous Q8H   Continuous Infusions: . sodium chloride 100 mL/hr at 01/15/13 0300    Principal Problem:  Fever Active Problems:   ASTHMA UNSPECIFIED WITH EXACERBATION   Chronic maxillary sinusitis   Hyponatremia   Hypokalemia    Time spent: >30 minutes   Shariah Assad  [facility] Pager [phone]. If 7PM-7AM, please contact night-coverage at [email or web address], password [facility] 01/16/2013, 1:10 PM  LOS: 2 days

## 2013-01-16 NOTE — Procedures (Signed)
[facility] PCCM  Consulted for lumbar puncture  Risks and benefits were discussed with the patient, consent signed.  The patient was positioned in the R lateral decubitus position and the lumbar spinous processes were identified with palpation.  Betadine was used for skin prep.  Sterile precautions (gown, glove, mask) was used.  2cc of 1% plain lidocaine was injected into the skin for anesthesia.  The spinal needle was inserted into the skin between the palpable spinous processes but fluid was not returned after two attempts.  The procedure was stopped and the primary service was notified.  No complications.  Yolonda Kida PCCM Pager: [phone] Cell: [phone] If no response, call [phone]

## 2013-01-16 NOTE — Plan of Care (Signed)
Problem: Phase II Progression Outcomes Goal: Progress activity as tolerated unless otherwise ordered Outcome: Progressing Lying flat x4 hours after LP under fluroscopy.

## 2013-01-16 NOTE — Progress Notes (Addendum)
Rounding in ED, patient's father and husband were present.  When father looked out from room, Chaplain engaged the father and he provided some family history.  Patient provided some of her recent history and the event that brought her to the ED.  Stated she was having headaches and had gone through all of her home interventions outlined by her doctor, and her doctor had told her to go to the ED, as what she did had not made any difference.  Patient was transferred to 1510.  Be apprised, that while the patient's father seemed more attached to his [place] heritage, the patient shares that same heritage, regardless of what she practices now.  Significant for prayer intervention, dietary needs and accommodations, and other particular rites, rituals and beliefs about God and illness.  Chaplain departed about 12:30am.  Rema Jasmine, Chaplain Pager: [phone]

## 2013-01-17 ENCOUNTER — Inpatient Hospital Stay (HOSPITAL_COMMUNITY): Payer: 59

## 2013-01-17 DIAGNOSIS — J329 Chronic sinusitis, unspecified: Secondary | ICD-10-CM

## 2013-01-17 LAB — CBC WITH DIFFERENTIAL/PLATELET
Basophils Absolute: 0 10*3/uL (ref 0.0–0.1)
Basophils Relative: 1 % (ref 0–1)
Eosinophils Absolute: 0 10*3/uL (ref 0.0–0.7)
Eosinophils Relative: 1 % (ref 0–5)
HCT: 37.4 % (ref 36.0–46.0)
Hemoglobin: 12.4 g/dL (ref 12.0–15.0)
Lymphocytes Relative: 14 % (ref 12–46)
Lymphs Abs: 0.8 10*3/uL (ref 0.7–4.0)
MCH: 28.4 pg (ref 26.0–34.0)
MCHC: 33.2 g/dL (ref 30.0–36.0)
MCV: 85.8 fL (ref 78.0–100.0)
Monocytes Absolute: 0.7 10*3/uL (ref 0.1–1.0)
Monocytes Relative: 11 % (ref 3–12)
Neutro Abs: 4.3 10*3/uL (ref 1.7–7.7)
Neutrophils Relative %: 74 % (ref 43–77)
Platelets: 197 10*3/uL (ref 150–400)
RBC: 4.36 MIL/uL (ref 3.87–5.11)
RDW: 12.5 % (ref 11.5–15.5)
WBC: 5.9 10*3/uL (ref 4.0–10.5)

## 2013-01-17 LAB — COMPREHENSIVE METABOLIC PANEL
ALT: 22 U/L (ref 0–35)
AST: 40 U/L — ABNORMAL HIGH (ref 0–37)
Albumin: 2.9 g/dL — ABNORMAL LOW (ref 3.5–5.2)
Alkaline Phosphatase: 64 U/L (ref 39–117)
BUN: 6 mg/dL (ref 6–23)
CO2: 29 mEq/L (ref 19–32)
Calcium: 8.7 mg/dL (ref 8.4–10.5)
Chloride: 96 mEq/L (ref 96–112)
Creatinine, Ser: 0.84 mg/dL (ref 0.50–1.10)
GFR calc Af Amer: >90 mL/min (ref >90)
GFR calc non Af Amer: 85 mL/min — ABNORMAL LOW (ref >90)
Glucose, Bld: 109 mg/dL — ABNORMAL HIGH (ref 70–99)
Potassium: 3.6 mEq/L (ref 3.5–5.1)
Sodium: 131 mEq/L — ABNORMAL LOW (ref 135–145)
Total Bilirubin: 0.9 mg/dL (ref 0.3–1.2)
Total Protein: 5.9 g/dL — ABNORMAL LOW (ref 6.0–8.3)

## 2013-01-17 LAB — CK TOTAL AND CKMB (NOT AT ARMC)
CK, MB: 16.8 ng/mL (ref 0.3–4.0)
Relative Index: 0.5 (ref 0.0–2.5)
Total CK: 3478 U/L — ABNORMAL HIGH (ref 7–177)

## 2013-01-17 LAB — VANCOMYCIN, TROUGH: Vancomycin Tr: 10.3 ug/mL (ref 10.0–20.0)

## 2013-01-17 LAB — CBC
HCT: 37.3 % (ref 36.0–46.0)
Hemoglobin: 12.4 g/dL (ref 12.0–15.0)
MCH: 28.5 pg (ref 26.0–34.0)
MCHC: 33.2 g/dL (ref 30.0–36.0)
MCV: 85.7 fL (ref 78.0–100.0)
Platelets: 167 10*3/uL (ref 150–400)
RBC: 4.35 MIL/uL (ref 3.87–5.11)
RDW: 12.5 % (ref 11.5–15.5)
WBC: 5.3 10*3/uL (ref 4.0–10.5)

## 2013-01-17 LAB — B. BURGDORFI ANTIBODIES: B burgdorferi Ab IgG+IgM: 0.31 {ISR}

## 2013-01-17 LAB — ROCKY MTN SPOTTED FVR AB, IGG-BLOOD: RMSF IgG: 0.1 IV

## 2013-01-17 LAB — LACTATE DEHYDROGENASE: LDH: 286 U/L — ABNORMAL HIGH (ref 94–250)

## 2013-01-17 LAB — CALCIUM, IONIZED: Calcium, Ion: 1.21 mmol/L (ref 1.12–1.23)

## 2013-01-17 LAB — CK: Total CK: 3012 U/L — ABNORMAL HIGH (ref 7–177)

## 2013-01-17 LAB — ROCKY MTN SPOTTED FVR AB, IGM-BLOOD: RMSF IgM: 0.07 IV (ref 0.00–0.89)

## 2013-01-17 LAB — SEDIMENTATION RATE: Sed Rate: 21 mm/hr (ref 0–22)

## 2013-01-17 MED ORDER — MUPIROCIN 2 % EX OINT
TOPICAL_OINTMENT | Freq: Two times a day (BID) | CUTANEOUS | Status: DC
  Administered 2013-01-17 – 2013-01-19 (×4): via NASAL
  Administered 2013-01-19: 1 via NASAL
  Administered 2013-01-20: 10:00:00 via NASAL

## 2013-01-17 MED ORDER — MUPIROCIN 2 % EX OINT
TOPICAL_OINTMENT | Freq: Two times a day (BID) | CUTANEOUS | Status: DC
  Filled 2013-01-17: qty 22

## 2013-01-17 MED ORDER — PREDNISONE 20 MG PO TABS
40.0000 mg | ORAL_TABLET | Freq: Every day | ORAL | Status: DC
  Filled 2013-01-17: qty 2

## 2013-01-17 MED ORDER — CYCLOBENZAPRINE HCL 5 MG PO TABS
7.5000 mg | ORAL_TABLET | Freq: Three times a day (TID) | ORAL | Status: DC | PRN
  Administered 2013-01-17: 7.5 mg via ORAL
  Filled 2013-01-17: qty 1.5

## 2013-01-17 MED ORDER — ALBUTEROL SULFATE (5 MG/ML) 0.5% IN NEBU: 2.5000 mg | INHALATION_SOLUTION | Freq: Four times a day (QID) | RESPIRATORY_TRACT | Status: DC | PRN

## 2013-01-17 NOTE — Progress Notes (Signed)
[facility] PROGRESS NOTE  [name] [name] NFA:[id number] DOB: 12-19-1970 DOA: 01/14/2013 PCP: Delorse Lek, MD  Assessment/Plan: 1-Fever: patient with hx of chronic sinusitis with recurrent infections. Came to hospital complaining of worsening HA and ongoing fever despite outpatient use of clindamycin. -ID on board to help with antibiotic choice and further recommendations -IR has performed LP w/o complications. (so far fluid analysis r/o meningitis) -continue supportive care and follow clinical response -will use flexeril to help with neck and HA pain  -blood cx's w/o growth -erlichia, RMSF, lyme antibodies pending. -ENT has been consulted for sinuses culture   2-hx of asthma: pulmicort discontinue given myalgias. No wheezing. Will use PRN albuterol.  3-Hyponatremia and hypokalemia: resolved with IVf's and electrolytes repletion  4-Chronic sinusitis: ID has recommended to continue vanc and will ask ENT to get culture sample for exam (r/o fungal infection and try to identify microorganism) treatment will be for 10 days -follow up with ENT as an outpatient after discharge Will start prednisone 40mg  daily along with abx's after cx taken.  5-muscle aches and cramps -will use flexeril -will also check CK levels and ionized calcium Discontinue pulmicort (associated with myalgias)  DVT: SCD's  Code Status: Full Family Communication: mother and best friend at bedside Disposition Plan: continue abx's, ENT for sinuses cx; follow ID recommendations   Consultants:  IR  ID  PCCM (for LP procedure)  ENT  Procedures:  PICC line place  LP under fluoroscopy  Antibiotics:  vancomycin  HPI/Subjective: Temp max 101.5; still complaining of HA's and also muscle ache (especially thighs and calf)   Objective: Filed Vitals:   01/17/13 0840 01/17/13 1016 01/17/13 1302 01/17/13 1442  BP:  105/58 117/77   Pulse:  85 89   Temp:  98 F (36.7 C) 98.4 F (36.9 C) 101.8 F  (38.8 C)  TempSrc:  Axillary Oral Oral  Resp:  16 18   Height:      Weight:      SpO2: 99% 96% 100%     Intake/Output Summary (Last 24 hours) at 01/17/13 1506 Last data filed at 01/17/13 1017  Gross per 24 hour  Intake   2200 ml  Output    300 ml  Net   1900 ml   Filed Weights   01/14/13 2130 01/15/13 0245  Weight: 76.204 kg (168 lb) 76.204 kg (168 lb)    Exam:   General:  NAD, AAOx3, still complaining of HA's and spiking fever  Cardiovascular: S1 and S2, no rubs or gallops  Respiratory: CTA bilaterally  Abdomen: soft, NT, ND, positive BS  Musculoskeletal: no edema, no cyanosis, no warm or hoffman signs on exam  Neuro: no focal deficit  Data Reviewed: Basic Metabolic Panel:  Recent Labs Lab 01/14/13 2155 01/15/13 0542 01/16/13 0618 01/17/13 0350  NA 131* 134* 132* 131*  K 3.4* 3.8 3.9 3.6  CL 96 98 97 96  CO2 24 29 31 29   GLUCOSE 116* 96 113* 109*  BUN 10 8 6 6   CREATININE 0.89 0.95 0.82 0.84  CALCIUM 8.8 9.0 9.0 8.7   CBC:  Recent Labs Lab 01/14/13 2155 01/15/13 0542 01/16/13 0618 01/17/13 0350  WBC 5.8 5.7 5.1 5.3  HGB 14.4 14.2 13.3 12.4  HCT 42.9 41.8 40.0 37.3  MCV 85.8 86.0 86.6 85.7  PLT 179 166 175 167    Studies: Dg Fluoro Guide Lumbar Puncture  01/16/2013   *RADIOLOGY REPORT*  Clinical Data:  Fever, headache  DIAGNOSTIC LUMBAR PUNCTURE UNDER FLUOROSCOPIC GUIDANCE  Fluoroscopy time:  22 seconds  Technique:  Informed consent was obtained from the patient prior to the procedure, including potential complications of headache, allergy, and pain.   With the patient prone, the lower back was prepped with Betadine.  1% Lidocaine was used for local anesthesia. Lumbar puncture was performed at the L3-L4 level using a 20 gauge needle with return of clear CSF.   Approximately 11 ml of CSF were obtained for laboratory studies.  The patient tolerated the procedure well and there were no apparent complications.  IMPRESSION:  Technically successful  lumbar puncture at the L3-L4 level using fluoroscopic guidance.  Aspirated CSF was sent to the laboratory for further analysis.   Original Report Authenticated By: Malachy Moan, M.D.    Scheduled Meds: . budesonide (PULMICORT) nebulizer solution  0.25 mg Nebulization BID  . diphenhydrAMINE  25 mg Intravenous Q8H  . vancomycin  750 mg Intravenous Q8H   Continuous Infusions: . sodium chloride 100 mL/hr at 01/15/13 0300    Principal Problem:   Fever Active Problems:   ASTHMA UNSPECIFIED WITH EXACERBATION   Chronic maxillary sinusitis   Hyponatremia   Hypokalemia    Time spent: >30 minutes   Dannon Nguyenthi  [facility] Pager [phone]. If 7PM-7AM, please contact night-coverage at [email or web address], password [facility] 01/17/2013, 3:06 PM  LOS: 3 days

## 2013-01-17 NOTE — Progress Notes (Signed)
ANTIBIOTIC CONSULT NOTE - FOLLOW UP  Pharmacy Consult for vancomycin Indication: sinusitis  Allergies  Allergen Reactions  . Amoxicillin-Pot Clavulanate Anaphylaxis  . Ceftriaxone Sodium Anaphylaxis  . Quinolones Anaphylaxis  . Erythromycin Diarrhea and Nausea And Vomiting  . Morphine Itching  . Sulfa Antibiotics Hives  . Azithromycin Hives and Rash    Patient Measurements: Height: 5\' 2"  (157.5 cm) Weight: 168 lb (76.204 kg) IBW/kg (Calculated) : 50.1   Vital Signs: Temp: 98 F (36.7 C) (08/04 1016) Temp src: Axillary (08/04 1016) BP: 105/58 mmHg (08/04 1016) Pulse Rate: 85 (08/04 1016) Intake/Output from previous day: 08/03 0701 - 08/04 0700 In: 4977 [P.O.:477; I.V.:3900; IV Piggyback:600] Out: 300 [Urine:300] Intake/Output from this shift: Total I/O In: 480 [P.O.:480] Out: -   Labs:  Recent Labs  01/15/13 0542 01/16/13 0618 01/17/13 0350  WBC 5.7 5.1 5.3  HGB 14.2 13.3 12.4  PLT 166 175 167  CREATININE 0.95 0.82 0.84   Estimated Creatinine Clearance: 84.2 ml/min (by C-G formula based on Cr of 0.84).  Recent Labs  01/17/13 1150  VANCOTROUGH 10.3     Microbiology: Recent Results (from the past 720 hour(s))  CULTURE, BLOOD (ROUTINE X 2)     Status: None   Collection Time    01/14/13  9:55 PM      Result Value Range Status   Specimen Description BLOOD RIGHT ARM   Final   Special Requests BOTTLES DRAWN AEROBIC AND ANAEROBIC 5CC   Final   Culture  Setup Time 01/15/2013 04:24   Final   Culture     Final   Value:        BLOOD CULTURE RECEIVED NO GROWTH TO DATE CULTURE WILL BE HELD FOR 5 DAYS BEFORE ISSUING A FINAL NEGATIVE REPORT   Report Status PENDING   Incomplete  CULTURE, BLOOD (ROUTINE X 2)     Status: None   Collection Time    01/15/13  1:57 AM      Result Value Range Status   Specimen Description BLOOD LEFT ARM   Final   Special Requests BOTTLES DRAWN AEROBIC AND ANAEROBIC 5CC   Final   Culture  Setup Time 01/15/2013 15:08   Final   Culture      Final   Value:        BLOOD CULTURE RECEIVED NO GROWTH TO DATE CULTURE WILL BE HELD FOR 5 DAYS BEFORE ISSUING A FINAL NEGATIVE REPORT   Report Status PENDING   Incomplete  CSF CULTURE     Status: None   Collection Time    01/16/13  2:40 PM      Result Value Range Status   Specimen Description CSF   Final   Special Requests Normal   Final   Gram Stain     Final   Value: NO WBC SEEN     NO ORGANISMS SEEN     Gram Stain Report Called to,Read Back By and Verified With: Gram Stain Report Called to,Read Back By and Verified With: MILLAR C AT 1641 [id number] BY POTEAT S Performed by [facility]   Culture NO GROWTH 1 DAY   Final   Report Status PENDING   Incomplete  GRAM STAIN     Status: None   Collection Time    01/16/13  2:40 PM      Result Value Range Status   Specimen Description CSF   Final   Special Requests Normal   Final   Gram Stain     Final  Value: NO WBC SEEN     NO ORGANISMS SEEN     Gram Stain Report Called to,Read Back By and Verified With: [facility] AT 1641 ON [id number] BY POTEAT,S   Report Status 01/16/2013 FINAL   Final    Anti-infectives   Start     Dose/Rate Route Frequency Ordered Stop   01/15/13 1200  vancomycin (VANCOCIN) IVPB 750 mg/150 ml premix     750 mg 75 mL/hr over 120 Minutes Intravenous Every 8 hours 01/15/13 1032     01/15/13 0230  vancomycin (VANCOCIN) IVPB 750 mg/150 ml premix  Status:  Discontinued     750 mg 150 mL/hr over 60 Minutes Intravenous Every 8 hours 01/15/13 0215 01/15/13 1031      Assessment: 42 y/o F with multiple antibiotic allergies now on D#3 of vancomycin 750 mg IV q8h for sinusitis unresponsive to oral clindamycin as outpatient.  Remains febrile.  ID recommendation noted for drainage by ENT with fluid sent for culture. .  Serum creatinine stable.  Note that some doses were missed on 8/2 and the morning of 8/3 due to problems with IV.  Now receiving vancomycin via PICC.  Vancomycin trough today 10.3.  Although this is  slightly below goal, anticipate further accumulation of drug with continued administration; will therefore not increase dosage at present.  Goal Range:  Vancomycin trough 15-20  Plan:  1. Continue present vancomycin dosage (750mg  IV q8h) 2. Follow serum creatinine. 3. Recheck vancomycin trough in 2 to 3 days.  Elie Goody, PharmD, BCPS Pager: [phone] 01/17/2013  1:07 PM

## 2013-01-17 NOTE — Progress Notes (Signed)
CRITICAL VALUE ALERT  Critical value received: CKMB 16.8  Date of notification:  01/17/13  Time of notification:  1632  Critical value read back:yes  Nurse who received alert:  Eugene Garnet  MD notified (1st page):  Dr. Gwenlyn Perking  Time of first page:  1635  MD notified (2nd page):  Time of second page:  Responding MD:  Dr. Gwenlyn Perking  Time MD responded:  [phone]

## 2013-01-17 NOTE — Progress Notes (Addendum)
[facility]  Date of Admission:  01/14/2013  Antibiotics: vancomycin  Subjective: Calf pain, still febrile  Objective: Temp:  [98 F (36.7 C)-101.2 F (38.4 C)] 98 F (36.7 C) (08/04 1016) Pulse Rate:  [80-90] 85 (08/04 1016) Resp:  [16-18] 16 (08/04 1016) BP: (105-120)/(50-78) 105/58 mmHg (08/04 1016) SpO2:  [96 %-100 %] 96 % (08/04 1016)  General: awake, alert, nad Skin: no rashes Lungs: CTA B Cor: RRR   Lab Results Lab Results  Component Value Date   WBC 5.3 01/17/2013   HGB 12.4 01/17/2013   HCT 37.3 01/17/2013   MCV 85.7 01/17/2013   PLT 167 01/17/2013    Lab Results  Component Value Date   CREATININE 0.84 01/17/2013   BUN 6 01/17/2013   NA 131* 01/17/2013   K 3.6 01/17/2013   CL 96 01/17/2013   CO2 29 01/17/2013    Lab Results  Component Value Date   ALT 22 01/17/2013   AST 40* 01/17/2013   ALKPHOS 64 01/17/2013   BILITOT 0.9 01/17/2013      Microbiology: Recent Results (from the past 240 hour(s))  CULTURE, BLOOD (ROUTINE X 2)     Status: None   Collection Time    01/14/13  9:55 PM      Result Value Range Status   Specimen Description BLOOD RIGHT ARM   Final   Special Requests BOTTLES DRAWN AEROBIC AND ANAEROBIC 5CC   Final   Culture  Setup Time 01/15/2013 04:24   Final   Culture     Final   Value:        BLOOD CULTURE RECEIVED NO GROWTH TO DATE CULTURE WILL BE HELD FOR 5 DAYS BEFORE ISSUING A FINAL NEGATIVE REPORT   Report Status PENDING   Incomplete  CULTURE, BLOOD (ROUTINE X 2)     Status: None   Collection Time    01/15/13  1:57 AM      Result Value Range Status   Specimen Description BLOOD LEFT ARM   Final   Special Requests BOTTLES DRAWN AEROBIC AND ANAEROBIC 5CC   Final   Culture  Setup Time 01/15/2013 15:08   Final   Culture     Final   Value:        BLOOD CULTURE RECEIVED NO GROWTH TO DATE CULTURE WILL BE HELD FOR 5 DAYS BEFORE ISSUING A FINAL NEGATIVE REPORT   Report Status PENDING   Incomplete  CSF CULTURE     Status: None   Collection Time    01/16/13  2:40 PM      Result Value Range Status   Specimen Description CSF   Final   Special Requests Normal   Final   Gram Stain     Final   Value: NO WBC SEEN     NO ORGANISMS SEEN     Gram Stain Report Called to,Read Back By and Verified With: Gram Stain Report Called to,Read Back By and Verified With: MILLAR C AT 1641 [id number] BY POTEAT S Performed by [facility]   Culture NO GROWTH 1 DAY   Final   Report Status PENDING   Incomplete  GRAM STAIN     Status: None   Collection Time    01/16/13  2:40 PM      Result Value Range Status   Specimen Description CSF   Final   Special Requests Normal   Final   Gram Stain     Final   Value: NO  WBC SEEN     NO ORGANISMS SEEN     Gram Stain Report Called to,Read Back By and Verified With: [facility] AT 1641 ON [id number] BY POTEAT,S   Report Status 01/16/2013 FINAL   Final    Studies/Results: Dg Fluoro Guide Lumbar Puncture  01/16/2013   *RADIOLOGY REPORT*  Clinical Data:  Fever, headache  DIAGNOSTIC LUMBAR PUNCTURE UNDER FLUOROSCOPIC GUIDANCE  Fluoroscopy time:  22 seconds  Technique:  Informed consent was obtained from the patient prior to the procedure, including potential complications of headache, allergy, and pain.   With the patient prone, the lower back was prepped with Betadine.  1% Lidocaine was used for local anesthesia. Lumbar puncture was performed at the L3-L4 level using a 20 gauge needle with return of clear CSF.   Approximately 11 ml of CSF were obtained for laboratory studies.  The patient tolerated the procedure well and there were no apparent complications.  IMPRESSION:  Technically successful lumbar puncture at the L3-L4 level using fluoroscopic guidance.  Aspirated CSF was sent to the laboratory for further analysis.   Original Report Authenticated By: Malachy Moan, M.D.    Assessment/Plan: 1)  Sinusitis - LP negative for meningitis, same headache pain.  I discussed the case with her primary  otolaryngologist Dr. Patterson Hammersmith at [facility] and her long and difficult course.  Unclear if she has some underlying autoimmune/Wegner's like syndrome.  Initially ANCA positive then negative and not felt likely by Rheum.  There may be some underlying autoimmune component.  ? Fungal allergy/infection.  With that, I would be concerned with non-bacterial possibility, such as fungal, mucor (though would anticipate her to be sicker) even though she is not known to be immunocomprimised.   -I have discussed with ENT here and to get drainage, culture for bacterial, fungal.  Pseudomonas also possible.   -I will add prednisone after culture is done and she can get prednisone 40 mg po daily for 5 days.  60 minutes spent including 35 minutes with face to face time    Staci Righter, MD [facility] [email or web address] [phone] pager   [phone] cell 01/17/2013, 12:21 PM

## 2013-01-17 NOTE — Consult Note (Signed)
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 Patterson Hammersmith at [facility] admitted with fevers. MRI showed some mild/moderate maxillary and ethmoid sinusitis so ENT consulted for scope and culture. Maxillofacial Ct ordered by me shows lund-mckay grade I moderate maxillary and left ethmoid sinusitis, total Lund-McKay score 3/24 with the posterior ethmoid, sphenoid, and frontal sinuses widely patent and well-aerated.  Allergies:  Allergies  Allergen Reactions  . Amoxicillin-Pot Clavulanate Anaphylaxis  . Ceftriaxone Sodium Anaphylaxis  . Quinolones Anaphylaxis  . Erythromycin Diarrhea and Nausea And Vomiting  . Morphine Itching  . Sulfa Antibiotics Hives  . Azithromycin Hives and Rash    ROS: fever/nasal drainage-otherwise negative x 10 systems except per HPI  PMH:  Past Medical History  Diagnosis Date  . Chest pain     Dr. Riley Kill  . Sinusitis     Acute-on-chronic, remote history of exercise-induced asthma  . Pneumonia 2007    hospital X 2 weeks  . Drug allergy     multiple- FActine - "Nearly killed me" in 2007 at admission. was in ICU. -Rocephin-2007- tongue swelling- Zpak- Rash in 2010  . Arthritis   . Asthma   . GERD (gastroesophageal reflux disease)   . Heart murmur     FH:  Family History  Problem Relation Age of Onset  . Diabetes Father   . Hypertension Mother   . COPD Mother     smokes  . Hypertension Maternal Uncle   . Hypertension Maternal Grandmother   . Thyroid cancer Sister   . Cancer Sister     thyroid  . Cancer Paternal Grandfather     bladder    SH:  History   Social History  . Marital Status: Married    Spouse Name: N/A    Number of Children: N/A  . Years of Education: N/A   Occupational History  . Account manager    Social History Main Topics  . Smoking status: Never Smoker   .  Smokeless tobacco: Never Used  . Alcohol Use: Yes     Comment: Rarely  . Drug Use: No  . Sexually Active: Yes    Birth Control/ Protection: IUD   Other Topics Concern  . Not on file   Social History Narrative   Drinks a lot of coffee    PSH:  Past Surgical History  Procedure Laterality Date  . 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 Nasal sinus surgery  2008    X 4, Revision right total ethmoidectomy with right frontal recess expliration, left frontal recess exploration, bilateral sphenoidotomies  . Cystectomy      vaginal cyst  . Laparoscopic appendectomy  10/22/2011    Procedure: APPENDECTOMY LAPAROSCOPIC;  Surgeon: Ernestene Mention, MD;  Location: [facility] ORS;  Service: General;  Laterality: N/A;  . Appendectomy  10/22/11    Physical  Exam: CN 2-12 grossly intact and symmetric. EAC/TMs normal BL. Oral cavity, lips, gums, ororpharynx normal with no masses or lesions. Skin warm and dry. Nasal cavity see endoscopic exam. External nose and ears without masses or lesions. EOMI, PERRLA. Neck supple with no masses or lesions. No lymphadenopathy palpated.  Procedure Note: [id number] Informed verbal consent was obtained after explaining the risks (including bleeding and infection), benefits and alternatives of the procedure. Verbal timeout was performed prior to the procedure.  The nose was topicalized with topical lidocaine/oxymetazoline. The flexible 4mm scope was advanced through the bilateral  nasal cavity. The septum and turbinates appeared normal. The middle meatus was free of polyps or purulence. there are patent maxillary antrostomies bilaterally with patent ethmoidectomies, sphenoidotomies, and Draf 2A frontal sinusotomies bilaterally with no polyps and only some mild/moderate yellow purulent mucous in the inferior maxillary sinus/middle meatus that I cultured for aerobes. No fungal elements or nonviable mucosa were seen. The patient tolerated the procedure with no immediate  complications.  A/P: mild/moderate bilateral maxillary sinusitis with widely patent/healthy sinusotomies. I recommend following up on the sinus culture and adjusting her antibiotic coverage as indicated. I also added mupirocin/saline irrigations as her sinus cavities are widely patent. Can consider prednisone or flonase as well. Can follow up with [facility] as scheduled.   Melvenia Beam 01/17/2013 6:03 PM

## 2013-01-17 NOTE — Progress Notes (Signed)
Complaining of right calf pain throughout night.. Calf tender to touch, no edema, good pedal pulses.  Min relief with robaxin

## 2013-01-18 DIAGNOSIS — R072 Precordial pain: Secondary | ICD-10-CM

## 2013-01-18 DIAGNOSIS — IMO0001 Reserved for inherently not codable concepts without codable children: Secondary | ICD-10-CM

## 2013-01-18 DIAGNOSIS — I059 Rheumatic mitral valve disease, unspecified: Secondary | ICD-10-CM

## 2013-01-18 LAB — RHEUMATOID FACTOR: Rhuematoid fact SerPl-aCnc: <10 IU/mL (ref <=14)

## 2013-01-18 LAB — MPO/PR-3 (ANCA) ANTIBODIES
Myeloperoxidase Abs: 23 AU/mL — ABNORMAL HIGH (ref <20)
Serine Protease 3: 1 AU/mL (ref <20)

## 2013-01-18 LAB — C-REACTIVE PROTEIN: CRP: 8.8 mg/dL — ABNORMAL HIGH (ref <0.60)

## 2013-01-18 LAB — CK TOTAL AND CKMB (NOT AT ARMC)
CK, MB: 18.6 ng/mL (ref 0.3–4.0)
Relative Index: 0.4 (ref 0.0–2.5)
Total CK: 5253 U/L — ABNORMAL HIGH (ref 7–177)

## 2013-01-18 LAB — ANA: Anti Nuclear Antibody(ANA): NEGATIVE

## 2013-01-18 MED ORDER — ENOXAPARIN SODIUM 40 MG/0.4ML ~~LOC~~ SOLN
40.0000 mg | SUBCUTANEOUS | Status: DC
  Administered 2013-01-18 – 2013-01-19 (×2): 40 mg via SUBCUTANEOUS
  Filled 2013-01-18 (×3): qty 0.4

## 2013-01-18 MED ORDER — METHYLPREDNISOLONE SODIUM SUCC 125 MG IJ SOLR
125.0000 mg | Freq: Four times a day (QID) | INTRAMUSCULAR | Status: DC
Start: 2013-01-18 — End: 2013-01-20
  Administered 2013-01-18 – 2013-01-20 (×7): 125 mg via INTRAVENOUS
  Filled 2013-01-18 (×11): qty 2

## 2013-01-18 MED ORDER — METHYLPREDNISOLONE SODIUM SUCC 125 MG IJ SOLR
125.0000 mg | Freq: Three times a day (TID) | INTRAMUSCULAR | Status: DC
  Administered 2013-01-18: 125 mg via INTRAVENOUS
  Filled 2013-01-18 (×3): qty 2

## 2013-01-18 NOTE — Progress Notes (Signed)
[facility] PROGRESS NOTE  [name] [name] ZOX:[id number] DOB: August 20, 1970 DOA: 01/14/2013 PCP: [name] Lek, MD  Assessment/Plan: 1-Fever: patient with hx of chronic sinusitis with recurrent infections. Came to hospital complaining of worsening HA and ongoing fever despite outpatient use of clindamycin. -ID on board to help with antibiotic choice and further recommendations -IR has performed LP w/o complications. (so far fluid analysis r/o meningitis) -continue supportive care and follow clinical response -will use flexeril to help with neck and HA's pain  -blood cx's w/o growth -erlichia antibody pending -RMSF and lyme antibodies negative -ENT has been consulted for sinuses culture  -started on solumedrol; see below.  2-hx of asthma: pulmicort discontinue given myalgias. No wheezing. Will use PRN albuterol.  3-Hyponatremia and hypokalemia: resolved with IVf's and electrolytes repletion. -will monitor  4-Chronic sinusitis: ID has recommended to continue vanc and treat for a total of 10 days -follow up with ENT as an outpatient  -also follow cx's from sinuses sample (pending)  5-muscle aches and cramps: most likely secondary to polymyositis or connective tissue disorder -will continue PRN flexeril -CK levels elevated, also elevated LDH and abnormal MPO ANCA. -C-RP, aldolase, anti-Jo 1, anti-SRP and anti-RNP has been ordered as indicated by rheumatology. Also started on solumedrol IV 125mg  Q6H -pulmicort discontinued (associated with myalgias) -follow labs and clinical response; dr. Dareen [name] has recommended 2 days of treatment and to call him back on Thursday with patient updates (office # [phone])  6-Pleuritic intermittent chest discomfort: -will check 2-D echo to r/o pericarditis (typically seen in patients with rheumatology disorder)  DVT: lovenox  Code Status: Full Family Communication: mother and best friend at bedside Disposition Plan: continue abx's, ENT for  sinuses cx; follow ID recommendations   Consultants:  IR  ID  PCCM (for LP procedure)  ENT  Dr. Dareen [name] ([facility] curb sided on the case)  Procedures:  PICC line place  LP under fluoroscopy  Antibiotics:  vancomycin  HPI/Subjective: Temp max 101; still complaining of HA's and also muscle ache (especially thighs, legs and hip muscles bilaterally). Complaining also of intermittent chest discomfort (pleuritic)  Objective: Filed Vitals:   01/18/13 0546 01/18/13 0951 01/18/13 1156 01/18/13 1359  BP: 108/70  107/69 121/81  Pulse: 77  85 94  Temp: 98.7 F (37.1 C) 98.8 F (37.1 C) 98.4 F (36.9 C) 99.3 F (37.4 C)  TempSrc: Oral Oral Oral Oral  Resp: 18  16 18   Height:      Weight:      SpO2:   98% 98%    Intake/Output Summary (Last 24 hours) at 01/18/13 1642 Last data filed at 01/18/13 1300  Gross per 24 hour  Intake   1320 ml  Output      0 ml  Net   1320 ml   Filed Weights   01/14/13 2130 01/15/13 0245  Weight: 76.204 kg (168 lb) 76.204 kg (168 lb)    Exam:   General:  NAD, AAOx3, still complaining of intermittent episodes HA's, severe muscle ache (affecting legs, thighs and hips bilaterally) and spiking fever  Cardiovascular: S1 and S2, no rubs or gallops  Respiratory: CTA bilaterally  Abdomen: soft, NT, ND, positive BS  Musculoskeletal: no edema, no cyanosis, no warm or hoffman signs on exam  Neuro: no focal deficit  Data Reviewed: Basic Metabolic Panel:  Recent Labs Lab 01/14/13 2155 01/15/13 0542 01/16/13 0618 01/17/13 0350  NA 131* 134* 132* 131*  K 3.4* 3.8 3.9 3.6  CL 96 98 97 96  CO2  24 29 31 29   GLUCOSE 116* 96 113* 109*  BUN 10 8 6 6   CREATININE 0.89 0.95 0.82 0.84  CALCIUM 8.8 9.0 9.0 8.7   CBC:  Recent Labs Lab 01/14/13 2155 01/15/13 0542 01/16/13 0618 February 09, 2013 0350 02/09/2013 1650  WBC 5.8 5.7 5.1 5.3 5.9  NEUTROABS  --   --   --   --  4.3  HGB 14.4 14.2 13.3 12.4 12.4  HCT 42.9 41.8 40.0 37.3 37.4   MCV 85.8 86.0 86.6 85.7 85.8  PLT 179 166 175 167 197    Studies: Ct Maxillofacial Wo Cm  09-Feb-2013   *RADIOLOGY REPORT*  Clinical Data: Sinusitis  CT MAXILLOFACIAL WITHOUT CONTRAST  Technique:  Multidetector CT imaging of the maxillofacial structures was performed. Multiplanar CT image reconstructions were also generated.  Comparison: MRI 01/13/2013.  CT 08/19/2005  Findings: Fusion protocol.  Partial bilateral ethmoidectomy.  Bilateral medial antrectomy.  Moderate mucosal thickening in the maxillary sinus bilaterally. Mild mucosal thickening in the ethmoid sinuses.  The frontal sinuses are hypoplastic but clear.  Sphenoid sinuses are clear.  No air-fluid level.  Mastoid sinus and middle ear are clear bilaterally.  No fracture or acute bony lesion.  Nasal septum shows mild deviation to the right.  IMPRESSION: Prior sinus surgery.  Moderate mucosal thickening in the maxillary sinus bilaterally.  There is also ethmoid mucosal thickening.  No air-fluid level.   Original Report Authenticated By: [name] [name], M.D.    Scheduled Meds: . diphenhydrAMINE  25 mg Intravenous Q8H  . methylPREDNISolone (SOLU-MEDROL) injection  125 mg Intravenous Q6H  . mupirocin ointment   Nasal BID  . vancomycin  750 mg Intravenous Q8H   Continuous Infusions: . sodium chloride 125 mL/hr at 02-09-13 1700    Principal Problem:   Fever Active Problems:   ASTHMA UNSPECIFIED WITH EXACERBATION   Chronic maxillary sinusitis   Hyponatremia   Hypokalemia    Time spent: >30 minutes   [name] [name]  [facility] Pager [phone]. If 7PM-7AM, please contact night-coverage at [email or web address], password [facility] 01/18/2013, 4:42 PM  LOS: 4 days

## 2013-01-18 NOTE — Progress Notes (Addendum)
[facility]  Date of Admission:  01/14/2013  Antibiotics: vancomycin  Subjective: Calf pain, still febrile, some chest discomfort  Objective: Temp:  [98.3 F (36.8 C)-101.8 F (38.8 C)] 98.4 F (36.9 C) (08/05 1156) Pulse Rate:  [77-96] 85 (08/05 1156) Resp:  [16-20] 16 (08/05 1156) BP: (100-112)/(54-70) 107/69 mmHg (08/05 1156) SpO2:  [97 %-100 %] 98 % (08/05 1156)  General: awake, alert, nad Skin: no rashes Lungs: CTA B Cor: RRR   Lab Results Lab Results  Component Value Date   WBC 5.9 01/17/2013   HGB 12.4 01/17/2013   HCT 37.4 01/17/2013   MCV 85.8 01/17/2013   PLT 197 01/17/2013    Lab Results  Component Value Date   CREATININE 0.84 01/17/2013   BUN 6 01/17/2013   NA 131* 01/17/2013   K 3.6 01/17/2013   CL 96 01/17/2013   CO2 29 01/17/2013    Lab Results  Component Value Date   ALT 22 01/17/2013   AST 40* 01/17/2013   ALKPHOS 64 01/17/2013   BILITOT 0.9 01/17/2013      Microbiology: Recent Results (from the past 240 hour(s))  CULTURE, BLOOD (ROUTINE X 2)     Status: None   Collection Time    01/14/13  9:55 PM      Result Value Range Status   Specimen Description BLOOD RIGHT ARM   Final   Special Requests BOTTLES DRAWN AEROBIC AND ANAEROBIC 5CC   Final   Culture  Setup Time 01/15/2013 04:24   Final   Culture     Final   Value:        BLOOD CULTURE RECEIVED NO GROWTH TO DATE CULTURE WILL BE HELD FOR 5 DAYS BEFORE ISSUING A FINAL NEGATIVE REPORT   Report Status PENDING   Incomplete  CULTURE, BLOOD (ROUTINE X 2)     Status: None   Collection Time    01/15/13  1:57 AM      Result Value Range Status   Specimen Description BLOOD LEFT ARM   Final   Special Requests BOTTLES DRAWN AEROBIC AND ANAEROBIC 5CC   Final   Culture  Setup Time 01/15/2013 15:08   Final   Culture     Final   Value:        BLOOD CULTURE RECEIVED NO GROWTH TO DATE CULTURE WILL BE HELD FOR 5 DAYS BEFORE ISSUING A FINAL NEGATIVE REPORT   Report Status PENDING   Incomplete  CSF  CULTURE     Status: None   Collection Time    01/16/13  2:40 PM      Result Value Range Status   Specimen Description CSF   Final   Special Requests Normal   Final   Gram Stain     Final   Value: NO WBC SEEN     NO ORGANISMS SEEN     Gram Stain Report Called to,Read Back By and Verified With: Gram Stain Report Called to,Read Back By and Verified With: MILLAR C AT 1641 [id number] BY POTEAT S Performed by [facility]   Culture NO GROWTH 1 DAY   Final   Report Status PENDING   Incomplete  GRAM STAIN     Status: None   Collection Time    01/16/13  2:40 PM      Result Value Range Status   Specimen Description CSF   Final   Special Requests Normal   Final   Gram Stain     Final  Value: NO WBC SEEN     NO ORGANISMS SEEN     Gram Stain Report Called to,Read Back By and Verified With: [facility] AT 1641 ON [id number] BY POTEAT,S   Report Status 01/16/2013 FINAL   Final  WOUND CULTURE     Status: None   Collection Time    01/17/13  6:00 PM      Result Value Range Status   Specimen Description NOSE   Final   Special Requests SET UP AS WOUND CULTURE SAVE SWAB FOR SHAVON.   Final   Gram Stain     Final   Value: NO WBC SEEN     NO SQUAMOUS EPITHELIAL CELLS SEEN     NO ORGANISMS SEEN     Performed at [facility]   Culture     Final   Value: NO GROWTH     Performed at [facility]   Report Status PENDING   Incomplete    Studies/Results: Ct Maxillofacial Wo Cm  01/17/2013   *RADIOLOGY REPORT*  Clinical Data: Sinusitis  CT MAXILLOFACIAL WITHOUT CONTRAST  Technique:  Multidetector CT imaging of the maxillofacial structures was performed. Multiplanar CT image reconstructions were also generated.  Comparison: MRI 01/13/2013.  CT 08/19/2005  Findings: Fusion protocol.  Partial bilateral ethmoidectomy.  Bilateral medial antrectomy.  Moderate mucosal thickening in the maxillary sinus bilaterally. Mild mucosal thickening in the ethmoid sinuses.  The frontal sinuses are hypoplastic  but clear.  Sphenoid sinuses are clear.  No air-fluid level.  Mastoid sinus and middle ear are clear bilaterally.  No fracture or acute bony lesion.  Nasal septum shows mild deviation to the right.  IMPRESSION: Prior sinus surgery.  Moderate mucosal thickening in the maxillary sinus bilaterally.  There is also ethmoid mucosal thickening.  No air-fluid level.   Original Report Authenticated By: Janeece Riggers, M.D.   Dg Fluoro Guide Lumbar Puncture  01/16/2013   *RADIOLOGY REPORT*  Clinical Data:  Fever, headache  DIAGNOSTIC LUMBAR PUNCTURE UNDER FLUOROSCOPIC GUIDANCE  Fluoroscopy time:  22 seconds  Technique:  Informed consent was obtained from the patient prior to the procedure, including potential complications of headache, allergy, and pain.   With the patient prone, the lower back was prepped with Betadine.  1% Lidocaine was used for local anesthesia. Lumbar puncture was performed at the L3-L4 level using a 20 gauge needle with return of clear CSF.   Approximately 11 ml of CSF were obtained for laboratory studies.  The patient tolerated the procedure well and there were no apparent complications.  IMPRESSION:  Technically successful lumbar puncture at the L3-L4 level using fluoroscopic guidance.  Aspirated CSF was sent to the laboratory for further analysis.   Original Report Authenticated By: Malachy Moan, M.D.    Assessment/Plan: 1)  Sinusitis - seems to be ok, not much drainage.    2) fever - seems less likely related to her sinuses, or even infection.  I think most likely there is some underlying autoimmune disorder/connective tissue issue.  Will start on steroids at a high dose, monitor for effect.  Work up sent.    3) myositis - likely related to underlying issue.  Getting steroids.    Staci Righter, MD [facility] [email or web address] [phone] pager   [phone] cell 01/18/2013, 1:30 PM

## 2013-01-18 NOTE — Progress Notes (Signed)
*  PRELIMINARY RESULTS* Echocardiogram 2D Echocardiogram has been performed.  [name] [name] 01/18/2013, 3:17 PM

## 2013-01-18 NOTE — Progress Notes (Signed)
Met and supported patient's father, [name] [name]. He used to be a [employer]'s [employer] for [place]. Supportive family with good spiritual support. Will continue to follow.

## 2013-01-19 DIAGNOSIS — R05 Cough: Secondary | ICD-10-CM

## 2013-01-19 DIAGNOSIS — R079 Chest pain, unspecified: Secondary | ICD-10-CM

## 2013-01-19 DIAGNOSIS — R059 Cough, unspecified: Secondary | ICD-10-CM

## 2013-01-19 LAB — ANCA TITERS: P-ANCA: 1:160 {titer} — ABNORMAL HIGH

## 2013-01-19 LAB — ANCA SCREEN W REFLEX TITER
Atypical p-ANCA Screen: NEGATIVE
c-ANCA Screen: NEGATIVE
p-ANCA Screen: POSITIVE — AB

## 2013-01-19 LAB — BASIC METABOLIC PANEL
BUN: 7 mg/dL (ref 6–23)
CO2: 28 mEq/L (ref 19–32)
Calcium: 8.9 mg/dL (ref 8.4–10.5)
Chloride: 101 mEq/L (ref 96–112)
Creatinine, Ser: 0.58 mg/dL (ref 0.50–1.10)
GFR calc Af Amer: >90 mL/min (ref >90)
GFR calc non Af Amer: >90 mL/min (ref >90)
Glucose, Bld: 172 mg/dL — ABNORMAL HIGH (ref 70–99)
Potassium: 3.7 mEq/L (ref 3.5–5.1)
Sodium: 135 mEq/L (ref 135–145)

## 2013-01-19 LAB — EHRLICHIA ANTIBODY PANEL
E chaffeensis (HGE) Ab, IgG: <1:64 {titer}
E chaffeensis (HGE) Ab, IgM: <1:20 {titer}

## 2013-01-19 LAB — JO-1 ANTIBODY-IGG: Jo-1 Antibody, IgG: <1 AU/mL (ref <30)

## 2013-01-19 MED ORDER — CYCLOBENZAPRINE HCL 5 MG PO TABS
5.0000 mg | ORAL_TABLET | Freq: Three times a day (TID) | ORAL | Status: DC | PRN
  Filled 2013-01-19: qty 1

## 2013-01-19 MED ORDER — DIPHENHYDRAMINE HCL 50 MG/ML IJ SOLN
25.0000 mg | Freq: Three times a day (TID) | INTRAMUSCULAR | Status: DC
  Administered 2013-01-19: 12:00:00 via INTRAVENOUS
  Filled 2013-01-19 (×2): qty 0.5
  Filled 2013-01-19: qty 1
  Filled 2013-01-19: qty 0.5

## 2013-01-19 NOTE — Progress Notes (Signed)
[facility] PROGRESS NOTE  [name] [name]:[id number] DOB: 1970-12-12 DOA: 01/14/2013 PCP: Delorse Lek, MD  Assessment/Plan: 1-Sinusitis -improved -LP negative -cultures negative so far -FU with Dr.Mims in [facility] -Stop Vanc and DC PICC  2-hx of asthma: pulmicort discontinue given myalgias. No wheezing. Will use PRN albuterol.  3-Hyponatremia and hypokalemia: resolved with IVf's and electrolytes repletion.  4. Myalgias/? 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 Viral, idiopathic or connective tissue disorder -CK levels elevated, also elevated LDH and abnormal MPO ANCA. -Dr.Madera discussed with Dr.Anderson and C-RP, aldolase, anti-Jo 1, anti-SRP and anti-RNP were ordered as indicated by rheumatology -significant clinical improvement on solumedrol IV 125mg  Q6H, continue this today and discuss with dr.Anderson tomorrow and Likely DC home on a Prednisone taper with quick Rheum FU -dr. Dareen Piano (office # [phone])  6-Pleuritic intermittent chest discomfort: -ECHO normal, no pericarditis  DVT: lovenox  Code Status: Full Family Communication: husband at bedside Disposition Plan: home tomorrow if stable   Consultants:  IR  ID  PCCM (for LP procedure)  ENT  Dr. Dareen Piano ([facility] : via telephone)  Procedures:  PICC line place  LP under fluoroscopy  Antibiotics:  vancomycin  HPI/Subjective: Muscle pains much better, weakness improved Some head ache this am  Objective: Filed Vitals:   01/18/13 1156 01/18/13 1359 01/18/13 2136 01/19/13 0454  BP: 107/69 121/81 118/72 122/65  Pulse: 85 94 96 78  Temp: 98.4 F (36.9 C) 99.3 F (37.4 C) 98.5 F (36.9 C) 98.2 F (36.8 C)  TempSrc: Oral Oral Oral Oral  Resp: 16 18 18 16   Height:      Weight:      SpO2: 98% 98% 96% 100%    Intake/Output Summary (Last 24 hours) at 01/19/13 1306 Last data filed at 01/18/13 1704  Gross per 24 hour  Intake 914.58 ml  Output    500 ml  Net  414.58 ml   Filed Weights   01/14/13 2130 01/15/13 0245  Weight: 76.204 kg (168 lb) 76.204 kg (168 lb)    Exam:   General:  NAD, AAOx3  Cardiovascular: S1 and S2, no rubs or gallops  Respiratory: CTA bilaterally  Abdomen: soft, NT, ND, positive BS  Musculoskeletal: no edema, no cyanosis  Mild tenderness of quadriceps and hamstrings and bilateral triceps but improved per pt  Neuro: no focal deficit  Data Reviewed: Basic Metabolic Panel:  Recent Labs Lab 01/14/13 2155 01/15/13 0542 01/16/13 0618 28-Jan-2013 0350 01/19/13 0635  NA 131* 134* 132* 131* 135  K 3.4* 3.8 3.9 3.6 3.7  CL 96 98 97 96 101  CO2 24 29 31 29 28   GLUCOSE 116* 96 113* 109* 172*  BUN 10 8 6 6 7   CREATININE 0.89 0.95 0.82 0.84 0.58  CALCIUM 8.8 9.0 9.0 8.7 8.9   CBC:  Recent Labs Lab 01/14/13 2155 01/15/13 0542 01/16/13 0618 01/28/2013 0350 01/28/2013 1650  WBC 5.8 5.7 5.1 5.3 5.9  NEUTROABS  --   --   --   --  4.3  HGB 14.4 14.2 13.3 12.4 12.4  HCT 42.9 41.8 40.0 37.3 37.4  MCV 85.8 86.0 86.6 85.7 85.8  PLT 179 166 175 167 197    Studies: Ct Maxillofacial Wo Cm  Jan 28, 2013   *RADIOLOGY REPORT*  Clinical Data: Sinusitis  CT MAXILLOFACIAL WITHOUT CONTRAST  Technique:  Multidetector CT imaging of the maxillofacial structures was performed. Multiplanar CT image reconstructions were also generated.  Comparison: MRI 01/13/2013.  CT 08/19/2005  Findings: Fusion protocol.  Partial bilateral ethmoidectomy.  Bilateral medial antrectomy.  Moderate mucosal thickening in the maxillary sinus bilaterally. Mild mucosal thickening in the ethmoid sinuses.  The frontal sinuses are hypoplastic but clear.  Sphenoid sinuses are clear.  No air-fluid level.  Mastoid sinus and middle ear are clear bilaterally.  No fracture or acute bony lesion.  Nasal septum shows mild deviation to the right.  IMPRESSION: Prior sinus surgery.  Moderate mucosal thickening in the maxillary sinus bilaterally.  There is also ethmoid mucosal  thickening.  No air-fluid level.   Original Report Authenticated By: Janeece Riggers, M.D.    Scheduled Meds: . diphenhydrAMINE  25 mg Intravenous Q8H  . enoxaparin (LOVENOX) injection  40 mg Subcutaneous Q24H  . methylPREDNISolone (SOLU-MEDROL) injection  125 mg Intravenous Q6H  . mupirocin ointment   Nasal BID   Continuous Infusions: . sodium chloride 75 mL/hr at 01/18/13 1704    Principal Problem:   Fever Active Problems:   ASTHMA UNSPECIFIED WITH EXACERBATION   Chronic maxillary sinusitis   Hyponatremia   Hypokalemia    Time spent: >30 minutes   [facility]  [facility] Pager [phone]. If 7PM-7AM, please contact night-coverage at [email or web address], password [facility] 01/19/2013, 1:06 PM  LOS: 5 days

## 2013-01-19 NOTE — Progress Notes (Signed)
[facility]  Date of Admission:  01/14/2013  Antibiotics: vancomycin  Subjective: Calf pain, still febrile, some chest discomfort  Objective: Temp:  [98.2 F (36.8 C)-99.3 F (37.4 C)] 98.2 F (36.8 C) (08/06 0454) Pulse Rate:  [78-96] 78 (08/06 0454) Resp:  [16-18] 16 (08/06 0454) BP: (118-122)/(65-81) 122/65 mmHg (08/06 0454) SpO2:  [96 %-100 %] 100 % (08/06 0454)  General: awake, alert, nad Skin: no rashes Lungs: CTA B Cor: RRR   Lab Results Lab Results  Component Value Date   WBC 5.9 01/17/2013   HGB 12.4 01/17/2013   HCT 37.4 01/17/2013   MCV 85.8 01/17/2013   PLT 197 01/17/2013    Lab Results  Component Value Date   CREATININE 0.58 01/19/2013   BUN 7 01/19/2013   NA 135 01/19/2013   K 3.7 01/19/2013   CL 101 01/19/2013   CO2 28 01/19/2013    Lab Results  Component Value Date   ALT 22 01/17/2013   AST 40* 01/17/2013   ALKPHOS 64 01/17/2013   BILITOT 0.9 01/17/2013      Microbiology: Recent Results (from the past 240 hour(s))  CULTURE, BLOOD (ROUTINE X 2)     Status: None   Collection Time    01/14/13  9:55 PM      Result Value Range Status   Specimen Description BLOOD RIGHT ARM   Final   Special Requests BOTTLES DRAWN AEROBIC AND ANAEROBIC 5CC   Final   Culture  Setup Time 01/15/2013 04:24   Final   Culture     Final   Value:        BLOOD CULTURE RECEIVED NO GROWTH TO DATE CULTURE WILL BE HELD FOR 5 DAYS BEFORE ISSUING A FINAL NEGATIVE REPORT   Report Status PENDING   Incomplete  CULTURE, BLOOD (ROUTINE X 2)     Status: None   Collection Time    01/15/13  1:57 AM      Result Value Range Status   Specimen Description BLOOD LEFT ARM   Final   Special Requests BOTTLES DRAWN AEROBIC AND ANAEROBIC 5CC   Final   Culture  Setup Time 01/15/2013 15:08   Final   Culture     Final   Value:        BLOOD CULTURE RECEIVED NO GROWTH TO DATE CULTURE WILL BE HELD FOR 5 DAYS BEFORE ISSUING A FINAL NEGATIVE REPORT   Report Status PENDING   Incomplete  CSF  CULTURE     Status: None   Collection Time    01/16/13  2:40 PM      Result Value Range Status   Specimen Description CSF   Final   Special Requests Normal   Final   Gram Stain     Final   Value: NO WBC SEEN     NO ORGANISMS SEEN     Gram Stain Report Called to,Read Back By and Verified With: Gram Stain Report Called to,Read Back By and Verified With: MILLAR C AT 1641 [id number] BY POTEAT S Performed by [facility]     Performed at [facility]   Culture     Final   Value: NO GROWTH 2 DAYS     Performed at [facility]   Report Status PENDING   Incomplete  GRAM STAIN     Status: None   Collection Time    01/16/13  2:40 PM      Result Value Range Status  Specimen Description CSF   Final   Special Requests Normal   Final   Gram Stain     Final   Value: NO WBC SEEN     NO ORGANISMS SEEN     Gram Stain Report Called to,Read Back By and Verified With: [facility] AT 1641 ON [id number] BY POTEAT,S   Report Status 01/16/2013 FINAL   Final  WOUND CULTURE     Status: None   Collection Time    01/17/13  6:00 PM      Result Value Range Status   Specimen Description NOSE   Final   Special Requests SET UP AS WOUND CULTURE SAVE SWAB FOR SHAVON.   Final   Gram Stain     Final   Value: NO WBC SEEN     NO SQUAMOUS EPITHELIAL CELLS SEEN     NO ORGANISMS SEEN     Performed at [facility]   Culture     Final   Value: NO GROWTH 1 DAY     Performed at [facility]   Report Status PENDING   Incomplete  FUNGUS CULTURE W SMEAR     Status: None   Collection Time    01/17/13  6:00 PM      Result Value Range Status   Specimen Description DRAINAGE SINUS DRAINAGE   Final   Special Requests NONE   Final   Fungal Smear     Final   Value: NO YEAST OR FUNGAL ELEMENTS SEEN     Performed at [facility]   Culture     Final   Value: CULTURE IN PROGRESS FOR FOUR WEEKS     Performed at [facility]   Report Status PENDING   Incomplete     Studies/Results: Ct Maxillofacial Wo Cm  01/17/2013   *RADIOLOGY REPORT*  Clinical Data: Sinusitis  CT MAXILLOFACIAL WITHOUT CONTRAST  Technique:  Multidetector CT imaging of the maxillofacial structures was performed. Multiplanar CT image reconstructions were also generated.  Comparison: MRI 01/13/2013.  CT 08/19/2005  Findings: Fusion protocol.  Partial bilateral ethmoidectomy.  Bilateral medial antrectomy.  Moderate mucosal thickening in the maxillary sinus bilaterally. Mild mucosal thickening in the ethmoid sinuses.  The frontal sinuses are hypoplastic but clear.  Sphenoid sinuses are clear.  No air-fluid level.  Mastoid sinus and middle ear are clear bilaterally.  No fracture or acute bony lesion.  Nasal septum shows mild deviation to the right.  IMPRESSION: Prior sinus surgery.  Moderate mucosal thickening in the maxillary sinus bilaterally.  There is also ethmoid mucosal thickening.  No air-fluid level.   Original Report Authenticated By: Janeece Riggers, M.D.    Assessment/Plan: 1)  Sinusitis - seems to be ok, not much drainage.  Cultures negative to date.    2) fever - doubt infection.  I will stop antibiotics at this point.  Can remove picc line at discharge.      3) myositis - likely related to underlying issue.  Getting steroids.  Continue IV steroids today, change to po tomorrow with close rheumatologic follow up.  She is doing MUCH better today.    4) cough - dry, non specific, no hypoxia, no productive.  ? Allergic, sinus-related.  No concerning signs.    5) chest pain - resolved.  Echo normal, no concerns of pericarditis, myocarditis.    Staci Righter, MD [facility] [email or web address] [phone] pager   [phone] cell 01/19/2013, 12:31 PM

## 2013-01-20 LAB — ALDOLASE: Aldolase: 27.1 U/L — ABNORMAL HIGH (ref <=8.1)

## 2013-01-20 LAB — WOUND CULTURE
Culture: NORMAL
Gram Stain: NONE SEEN

## 2013-01-20 LAB — CSF CULTURE W GRAM STAIN
Culture: NO GROWTH
Gram Stain: NONE SEEN
Special Requests: NORMAL

## 2013-01-20 LAB — C-REACTIVE PROTEIN: CRP: 3.7 mg/dL — ABNORMAL HIGH (ref <0.60)

## 2013-01-20 LAB — CK: Total CK: 1175 U/L — ABNORMAL HIGH (ref 7–177)

## 2013-01-20 MED ORDER — PREDNISONE 50 MG PO TABS
60.0000 mg | ORAL_TABLET | Freq: Every day | ORAL | Status: DC
  Administered 2013-01-20: 60 mg via ORAL
  Filled 2013-01-20 (×2): qty 1

## 2013-01-20 MED ORDER — ZOLPIDEM TARTRATE 5 MG PO TABS: 5.0000 mg | ORAL_TABLET | Freq: Every evening | ORAL | Status: DC | PRN

## 2013-01-20 MED ORDER — PREDNISONE 20 MG PO TABS: 60.0000 mg | ORAL_TABLET | Freq: Every day | ORAL | Status: DC

## 2013-01-20 MED ORDER — PANTOPRAZOLE SODIUM 40 MG PO TBEC: 40.0000 mg | DELAYED_RELEASE_TABLET | Freq: Every day | ORAL | Status: DC

## 2013-01-20 NOTE — Progress Notes (Signed)
RUA PICC line removed per order.  No signs of infection or bleeding noted.  Pt and family verbalizes understanding of signs of infection and interventions for infection or bleeding to call md.  No ADN.

## 2013-01-20 NOTE — Progress Notes (Signed)
Pt ready for d/c. This RN went over d/c instructions with pt. PIV was removed by IV team. Awaiting last BP then pt will be ready to leave. No change in pt condition since AM assessment.   PICC removal site WNL. Pt d/c'd to home. Eugene Garnet RN

## 2013-01-20 NOTE — Progress Notes (Signed)
[facility]  Date of Admission:  01/14/2013  Antibiotics: Vancomycin - stopped  Subjective: Feels well, now afebrile since starting steroids  Objective: Temp:  [98.1 F (36.7 C)] 98.1 F (36.7 C) (08/06 2200) Pulse Rate:  [78-90] 78 (08/06 2200) Resp:  [18] 18 (08/06 2200) BP: (107-113)/(50-69) 107/50 mmHg (08/06 2200) SpO2:  [96 %-97 %] 96 % (08/06 2200)  General: awake, alert, nad Skin: no rashes Lungs: CTA B Cor: RRR   Lab Results Lab Results  Component Value Date   WBC 5.9 01/17/2013   HGB 12.4 01/17/2013   HCT 37.4 01/17/2013   MCV 85.8 01/17/2013   PLT 197 01/17/2013    Lab Results  Component Value Date   CREATININE 0.58 01/19/2013   BUN 7 01/19/2013   NA 135 01/19/2013   K 3.7 01/19/2013   CL 101 01/19/2013   CO2 28 01/19/2013    Lab Results  Component Value Date   ALT 22 01/17/2013   AST 40* 01/17/2013   ALKPHOS 64 01/17/2013   BILITOT 0.9 01/17/2013      Microbiology: Recent Results (from the past 240 hour(s))  CULTURE, BLOOD (ROUTINE X 2)     Status: None   Collection Time    01/14/13  9:55 PM      Result Value Range Status   Specimen Description BLOOD RIGHT ARM   Final   Special Requests BOTTLES DRAWN AEROBIC AND ANAEROBIC 5CC   Final   Culture  Setup Time 01/15/2013 04:24   Final   Culture     Final   Value:        BLOOD CULTURE RECEIVED NO GROWTH TO DATE CULTURE WILL BE HELD FOR 5 DAYS BEFORE ISSUING A FINAL NEGATIVE REPORT   Report Status PENDING   Incomplete  CULTURE, BLOOD (ROUTINE X 2)     Status: None   Collection Time    01/15/13  1:57 AM      Result Value Range Status   Specimen Description BLOOD LEFT ARM   Final   Special Requests BOTTLES DRAWN AEROBIC AND ANAEROBIC 5CC   Final   Culture  Setup Time 01/15/2013 15:08   Final   Culture     Final   Value:        BLOOD CULTURE RECEIVED NO GROWTH TO DATE CULTURE WILL BE HELD FOR 5 DAYS BEFORE ISSUING A FINAL NEGATIVE REPORT   Report Status PENDING   Incomplete  CSF CULTURE      Status: None   Collection Time    01/16/13  2:40 PM      Result Value Range Status   Specimen Description CSF   Final   Special Requests Normal   Final   Gram Stain     Final   Value: NO WBC SEEN     NO ORGANISMS SEEN     Gram Stain Report Called to,Read Back By and Verified With: Gram Stain Report Called to,Read Back By and Verified With: MILLAR C AT 1641 [id number] BY POTEAT S Performed by [facility]     Performed at [facility]   Culture     Final   Value: NO GROWTH 3 DAYS     Performed at [facility]   Report Status PENDING   Incomplete  GRAM STAIN     Status: None   Collection Time    01/16/13  2:40 PM      Result Value Range Status   Specimen  Description CSF   Final   Special Requests Normal   Final   Gram Stain     Final   Value: NO WBC SEEN     NO ORGANISMS SEEN     Gram Stain Report Called to,Read Back By and Verified With: [facility] AT 1641 ON [id number] BY POTEAT,S   Report Status 01/16/2013 FINAL   Final  WOUND CULTURE     Status: None   Collection Time    01/17/13  6:00 PM      Result Value Range Status   Specimen Description NOSE   Final   Special Requests SET UP AS WOUND CULTURE SAVE SWAB FOR SHAVON.   Final   Gram Stain     Final   Value: NO WBC SEEN     NO SQUAMOUS EPITHELIAL CELLS SEEN     NO ORGANISMS SEEN     Performed at [facility]   Culture     Final   Value: NORMAL NASOPHARYNGEAL FLORA     Performed at [facility]   Report Status 01/20/2013 FINAL   Final  FUNGUS CULTURE W SMEAR     Status: None   Collection Time    01/17/13  6:00 PM      Result Value Range Status   Specimen Description DRAINAGE SINUS DRAINAGE   Final   Special Requests NONE   Final   Fungal Smear     Final   Value: NO YEAST OR FUNGAL ELEMENTS SEEN     Performed at [facility]   Culture     Final   Value: CULTURE IN PROGRESS FOR FOUR WEEKS     Performed at [facility]   Report Status PENDING   Incomplete     Studies/Results: No results found.  Assessment/Plan: 1)  Sinusitis - seems to be ok, not much drainage.  Cultures negative to date.  Likely not cause of fever.  2) fever - doubt infection.  Stopped vancomycin.       3) myositis - likely related to underlying issue.  Doing well.  Follow up with Rheumatology.    4) cough - dry, non specific, no hypoxia, no productive.  ? Allergic, sinus-related.  No concerning signs.    She has requested a copy of the MRI to give to Dr. Patterson Hammersmith, her ENT at discharge, if possible (done by her primary).    Staci Righter, MD [facility] [email or web address] [phone] pager   [phone] cell 01/20/2013, 8:24 AM

## 2013-01-21 LAB — CULTURE, BLOOD (ROUTINE X 2)
Culture: NO GROWTH
Culture: NO GROWTH

## 2013-01-21 LAB — HSV(HERPES SMPLX VRS)ABS-I+II(IGG)-CSF

## 2013-01-26 DIAGNOSIS — R479 Unspecified speech disturbances: Secondary | ICD-10-CM | POA: Insufficient documentation

## 2013-01-28 LAB — MISCELLANEOUS TEST
Miscellaneous Test: 81716
Miscellaneous Test: 1227

## 2013-02-03 ENCOUNTER — Other Ambulatory Visit: Payer: Self-pay | Admitting: Endocrinology

## 2013-02-03 DIAGNOSIS — E049 Nontoxic goiter, unspecified: Secondary | ICD-10-CM

## 2013-02-04 ENCOUNTER — Ambulatory Visit
Admission: RE | Admit: 2013-02-04 | Discharge: 2013-02-04 | Disposition: A | Discharge status: Home / Self Care | Payer: 59 | Source: Ambulatory Visit | Attending: Endocrinology | Admitting: Endocrinology

## 2013-02-04 DIAGNOSIS — E049 Nontoxic goiter, unspecified: Secondary | ICD-10-CM

## 2013-02-09 NOTE — Discharge Summary (Signed)
Physician Discharge Summary  CLARABELL MATSUOKA ZOX:[id number] DOB: January 16, 1971 DOA: 01/14/2013  PCP: Delorse Lek, MD  Admit date: 01/14/2013 Discharge date: 01/20/2013  Time spent: 50 minutes  Recommendations for Outpatient Follow-up:  1. 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 female with a history of recurrent Sinus infections and sinus Surgery X5 who presents to the ED with complaints of continued fevers and chills that worsened today despite 1 week of clindamycin therapy. She reports having a higher fever today to 103, and she reports beginning to have a cough and SOB and chest tightness. She had to use her rescue inhaler at home PTA. She was evaluated in the ED and a Chest X-ray was performed and blood cultures were sent and she was referred for medical admission.    Hospital Course:  1-Sinusitis  -improved  -LP negative  -cultures negative so far  -FU with Dr.Mims in [facility]  -Stopped  Vanc after 3days and removed PICC  -suspect that fever was related to connective tissue problem and not to sinusistis  2-hx of asthma: stable,PRN albuterol.   3-Hyponatremia and hypokalemia: resolved with IVf's and electrolytes repletion.   4. Myalgias/ Myositis  -possibly secondary to polymyositis ? Viral, idiopathic or connective tissue disorder  -CK levels elevated, also elevated LDH and abnormal MPO ANCA.  -Dr.Madera discussed with Dr.Anderson and C-RP, aldolase, anti-Jo 1, anti-SRP and anti-RNP were ordered as indicated by rheumatology and started on IV solumedrol. -She had significant clinical improvement on solumedrol  IV 125mg  Q6H, after 2days of steroids i called and discussed with Dr.Anderson who recommended discharge home on Prednisone 60mg  daily and Fu with him in 3days arranged  6-Pleuritic intermittent chest discomfort:  -ECHO normal, no pericarditis      Procedures: LP negative PICC Line  Consultations: IR  ID  PCCM (for LP procedure)  ENT  Dr. Dareen Piano ([facility] : via telephone)   Discharge Exam: Filed Vitals:   01/20/13 1126  BP: 111/68  Pulse: 93  Temp:   Resp:     General: AAOx3 Cardiovascular: S1S2/RRR Respiratory: CTAB  Discharge Instructions  Discharge Orders   Future Orders Complete By Expires   Increase activity slowly  As directed        Medication List    STOP taking these medications       budesonide 1 MG/2ML nebulizer solution  Commonly known as:  PULMICORT     clindamycin 300 MG capsule  Commonly known as:  CLEOCIN     ibuprofen 800 MG tablet  Commonly known as:  ADVIL,MOTRIN      TAKE these medications       beclomethasone 80 MCG/ACT inhaler  Commonly known as:  QVAR  Inhale 2 puffs into the lungs 2 (two) times daily.     EPINEPHrine 0.3 mg/0.3 mL Soaj injection  Commonly known as:  EPI-PEN  Inject 0.3 mg into the muscle once as needed (allergic reaction).     levonorgestrel 20 MCG/24HR IUD  Commonly known as:  MIRENA  1 each by Intrauterine route once. Inserted in 2011     pantoprazole 40 MG  tablet  Commonly known as:  PROTONIX  Take 1 tablet (40 mg total) by mouth at bedtime.     predniSONE 20 MG tablet  Commonly known as:  DELTASONE  Take 3 tablets (60 mg total) by mouth daily.     PROAIR HFA 108 (90 BASE) MCG/ACT inhaler  Generic drug:  albuterol  - Inhale 2 puffs into the lungs every 4 (four) hours as needed for wheezing or shortness of breath. For shortness of breath  -      zolpidem 5 MG tablet  Commonly known as:  AMBIEN  Take 1 tablet (5 mg total) by mouth at bedtime as needed for sleep (insomnia).        Allergies  Allergen Reactions  . Amoxicillin-Pot Clavulanate Anaphylaxis  . Ceftriaxone Sodium Anaphylaxis  . Quinolones Anaphylaxis  . Erythromycin Diarrhea and Nausea And Vomiting  . Morphine Itching  . Sulfa Antibiotics Hives  . Azithromycin Hives and Rash       Follow-up Information   Follow up with Azzie Roup, MD On 01/24/2013. (at 2pm)    Specialty:  Rheumatology   Contact information:   [address] [place] [address] [phone]        The results of significant diagnostics from this hospitalization (including imaging, microbiology, ancillary and laboratory) are listed below for reference.    Significant Diagnostic Studies: Dg Chest 2 View  01/15/2013   *RADIOLOGY REPORT*  Clinical Data: Shortness of breath.  CHEST - 2 VIEW  Comparison: 10/01/2011.  Findings: No significant osseous abnormality.  Lungs are clear. No effusion or pneumothorax.  Cardiomediastinal size and contour are within normal limits.  The upper abdomen is unremarkable.  IMPRESSION: No evidence of acute cardiopulmonary disease.   Original Report Authenticated By: Tiburcio Pea   Mr Brain Wo Contrast  01/13/2013   *RADIOLOGY REPORT*  Clinical Data: Headaches for 2 weeks.  Fever.  History multiple sinus surgeries.  MRI HEAD WITHOUT CONTRAST  Technique:  Multiplanar, multiecho pulse sequences of the brain and surrounding structures were obtained according to standard protocol without intravenous contrast.  Comparison: Sinus CT 08/19/2005.  Findings: Midline structures are within normal limits.  No acute infarct, hemorrhage, or mass lesion is present.  The ventricles are of normal size.  No significant extra-axial fluid collection is present.  Postoperative changes of bilateral nasal antrostomies and ethmoidectomies are again noted.  Circumferential mucosal thickening is present in both maxillary sinuses with some fluid on the left.  Mild mucosal thickening is present throughout the  residual ethmoid air cells and left frontal sinus.  The right frontal sinus is not pneumatized.  The sphenoid sinuses and mastoid air cells are clear.  IMPRESSION:  1.  Normal MRI appearance of the brain. 2.  Residual acute and chronic sinus disease as described despite bilateral nasal antrostomies and partial ethmoidectomies.  There is some fluid in the left maxillary sinus.   Original Report Authenticated By: Marin Roberts, M.D.   US Soft Tissue Head/neck  02/04/2013   CLINICAL DATA:  Enlarged thyroid.  EXAM: THYROID ULTRASOUND  TECHNIQUE: Ultrasound examination of the thyroid gland and adjacent soft tissues was performed.  COMPARISON:  None.  FINDINGS: Right thyroid lobe: 5.1 x 1.5 x 1.9 cm.  Left thyroid lobe:  5.7 x 1.6 x 1.9 cm.  Isthmus:  2 mm.  Focal nodules: Multiple bilateral predominantly solid hypoechoic nodules. There are a few small scattered subcentimeter cystic nodules. There is a 2.4 x 1.3 x 1.2 cm solid isoechoic nodule  in the left midpole with microcalcifications. In the right upper pole, there is a 1.6 x 1.4 x 0.9 cm hypoechoic nodule with microcalcifications. In the right midpole there is a 1.3 x 1.1 x 0.7 cm hypoechoic nodule with microcalcifications.  Lymphadenopathy:  None visualized.  IMPRESSION: Numerous bilateral thyroid nodules. The largest nodules are measured above. These 3 nodules contain microcalcifications. Findings meet consensus criteria for biopsy. Ultrasound-guided fine needle aspiration should be considered, as per the consensus statement: Management of Thyroid Nodules Detected at [place]: Society of Radiologists in Ultrasound Consensus Conference Statement. Radiology 2005; [id number].   Electronically Signed   By: Charlett Nose   On: 02/04/2013 12:36   Ct Maxillofacial Wo Cm  01/17/2013   *RADIOLOGY REPORT*  Clinical Data: Sinusitis  CT MAXILLOFACIAL WITHOUT CONTRAST  Technique:  Multidetector CT imaging of the maxillofacial structures was performed. Multiplanar CT image  reconstructions were also generated.  Comparison: MRI 01/13/2013.  CT 08/19/2005  Findings: Fusion protocol.  Partial bilateral ethmoidectomy.  Bilateral medial antrectomy.  Moderate mucosal thickening in the maxillary sinus bilaterally. Mild mucosal thickening in the ethmoid sinuses.  The frontal sinuses are hypoplastic but clear.  Sphenoid sinuses are clear.  No air-fluid level.  Mastoid sinus and middle ear are clear bilaterally.  No fracture or acute bony lesion.  Nasal septum shows mild deviation to the right.  IMPRESSION: Prior sinus surgery.  Moderate mucosal thickening in the maxillary sinus bilaterally.  There is also ethmoid mucosal thickening.  No air-fluid level.   Original Report Authenticated By: Janeece Riggers, M.D.   Dg Fluoro Guide Lumbar Puncture  01/16/2013   *RADIOLOGY REPORT*  Clinical Data:  Fever, headache  DIAGNOSTIC LUMBAR PUNCTURE UNDER FLUOROSCOPIC GUIDANCE  Fluoroscopy time:  22 seconds  Technique:  Informed consent was obtained from the patient prior to the procedure, including potential complications of headache, allergy, and pain.   With the patient prone, the lower back was prepped with Betadine.  1% Lidocaine was used for local anesthesia. Lumbar puncture was performed at the L3-L4 level using a 20 gauge needle with return of clear CSF.   Approximately 11 ml of CSF were obtained for laboratory studies.  The patient tolerated the procedure well and there were no apparent complications.  IMPRESSION:  Technically successful lumbar puncture at the L3-L4 level using fluoroscopic guidance.  Aspirated CSF was sent to the laboratory for further analysis.   Original Report Authenticated By: Malachy Moan, M.D.    Microbiology: No results found for this or any previous visit (from the past 240 hour(s)).   Labs: Basic Metabolic Panel: No results found for this basename: NA, K, CL, CO2, GLUCOSE, BUN, CREATININE, CALCIUM, MG, PHOS,  in the last 168 hours Liver Function Tests: No  results found for this basename: AST, ALT, ALKPHOS, BILITOT, PROT, ALBUMIN,  in the last 168 hours No results found for this basename: LIPASE, AMYLASE,  in the last 168 hours No results found for this basename: AMMONIA,  in the last 168 hours CBC: No results found for this basename: WBC, NEUTROABS, HGB, HCT, MCV, PLT,  in the last 168 hours Cardiac Enzymes: No results found for this basename: CKTOTAL, CKMB, CKMBINDEX, TROPONINI,  in the last 168 hours BNP: BNP (last 3 results) No results found for this basename: PROBNP,  in the last 8760 hours CBG: No results found for this basename: GLUCAP,  in the last 168 hours     Signed:  Thresia Ramanathan  [facility] 02/09/2013, 2:25 PM

## 2013-02-15 LAB — FUNGUS CULTURE W SMEAR: Fungal Smear: NONE SEEN

## 2013-02-17 ENCOUNTER — Other Ambulatory Visit: Payer: Self-pay | Admitting: Endocrinology

## 2013-02-17 DIAGNOSIS — E041 Nontoxic single thyroid nodule: Secondary | ICD-10-CM

## 2013-02-18 ENCOUNTER — Other Ambulatory Visit: Payer: Self-pay | Admitting: Endocrinology

## 2013-02-18 DIAGNOSIS — E041 Nontoxic single thyroid nodule: Secondary | ICD-10-CM

## 2013-02-22 DIAGNOSIS — M629 Disorder of muscle, unspecified: Secondary | ICD-10-CM | POA: Insufficient documentation

## 2013-02-23 ENCOUNTER — Ambulatory Visit
Admission: RE | Admit: 2013-02-23 | Discharge: 2013-02-23 | Disposition: A | Discharge status: Home / Self Care | Payer: 59 | Source: Ambulatory Visit | Attending: Endocrinology | Admitting: Endocrinology

## 2013-02-23 ENCOUNTER — Other Ambulatory Visit (HOSPITAL_COMMUNITY)
Admission: RE | Admit: 2013-02-23 | Discharge: 2013-02-23 | Disposition: A | Discharge status: Home / Self Care | Payer: 59 | Source: Ambulatory Visit | Attending: Interventional Radiology | Admitting: Interventional Radiology

## 2013-02-23 DIAGNOSIS — E041 Nontoxic single thyroid nodule: Secondary | ICD-10-CM

## 2013-03-09 ENCOUNTER — Other Ambulatory Visit: Payer: Self-pay | Admitting: Rheumatology

## 2013-03-09 DIAGNOSIS — E041 Nontoxic single thyroid nodule: Secondary | ICD-10-CM

## 2013-05-25 ENCOUNTER — Other Ambulatory Visit: Payer: Self-pay | Admitting: Obstetrics and Gynecology

## 2013-06-02 ENCOUNTER — Other Ambulatory Visit: Payer: Self-pay | Admitting: Obstetrics and Gynecology

## 2013-06-02 DIAGNOSIS — R928 Other abnormal and inconclusive findings on diagnostic imaging of breast: Secondary | ICD-10-CM

## 2013-06-10 ENCOUNTER — Ambulatory Visit
Admission: RE | Admit: 2013-06-10 | Discharge: 2013-06-10 | Disposition: A | Discharge status: Home / Self Care | Payer: 59 | Source: Ambulatory Visit | Attending: Obstetrics and Gynecology | Admitting: Obstetrics and Gynecology

## 2013-06-10 DIAGNOSIS — R928 Other abnormal and inconclusive findings on diagnostic imaging of breast: Secondary | ICD-10-CM

## 2013-07-01 DIAGNOSIS — Z87898 Personal history of other specified conditions: Secondary | ICD-10-CM | POA: Insufficient documentation

## 2013-08-29 ENCOUNTER — Other Ambulatory Visit: Payer: 59

## 2013-10-12 ENCOUNTER — Telehealth: Payer: Self-pay | Admitting: Internal Medicine

## 2013-10-12 NOTE — Telephone Encounter (Signed)
error 

## 2013-10-17 ENCOUNTER — Other Ambulatory Visit: Payer: 59

## 2013-10-17 ENCOUNTER — Ambulatory Visit
Admission: RE | Admit: 2013-10-17 | Discharge: 2013-10-17 | Disposition: A | Discharge status: Home / Self Care | Payer: 59 | Source: Ambulatory Visit | Attending: Rheumatology | Admitting: Rheumatology

## 2013-10-17 DIAGNOSIS — E041 Nontoxic single thyroid nodule: Secondary | ICD-10-CM

## 2013-10-26 IMAGING — US US THYROID BIOPSY
1 series · 14 of 25 positions shown · non-contrast
Comparison: none

Clinical Data/Indication: Bilateral thyroid nodules

ULTRASOUND-GUIDED BIOPSY OF THREE THYROID NODULES.  FINE NEEDLE
ASPIRATION.
Procedure: The procedure, risks, benefits, and alternatives were
explained to the patient. Questions regarding the procedure were
encouraged and answered. The patient understands and consents to
the procedure.
The neck was prepped with dated in a sterile fashion, and a sterile
drape was applied covering the operative field. A surgical mask and
sterile gloves were used for the procedure.
Under sonographic guidance, three 25 gauge fine needle aspirates of
the each of the three described calcified thyroid nodules were
obtained.  One of these is in the left lobe and two are on the
right. Final imaging was performed.

[Series 1: us thyroid biopsy · 0.07mm/px · 31 acquisitions, 14 frames shown]
[im 1/31]
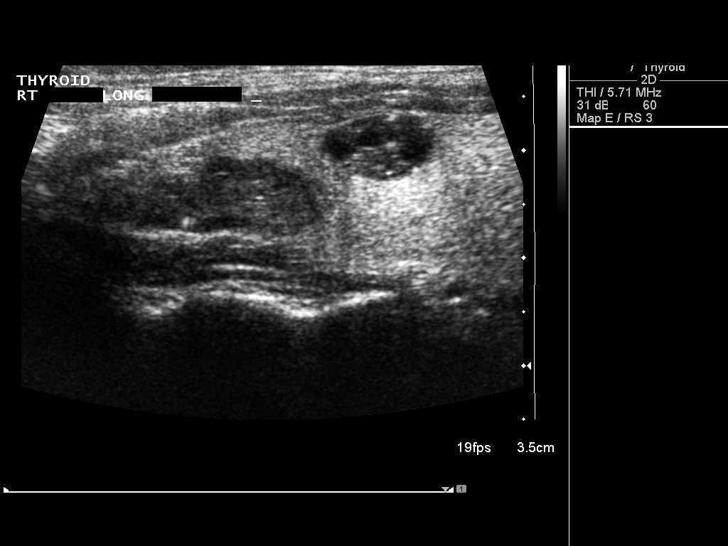
[im 3/31]
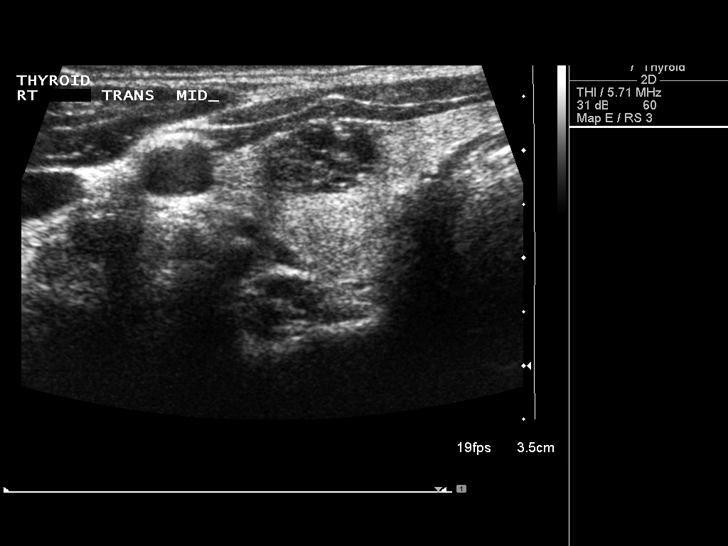
[im 6/31]
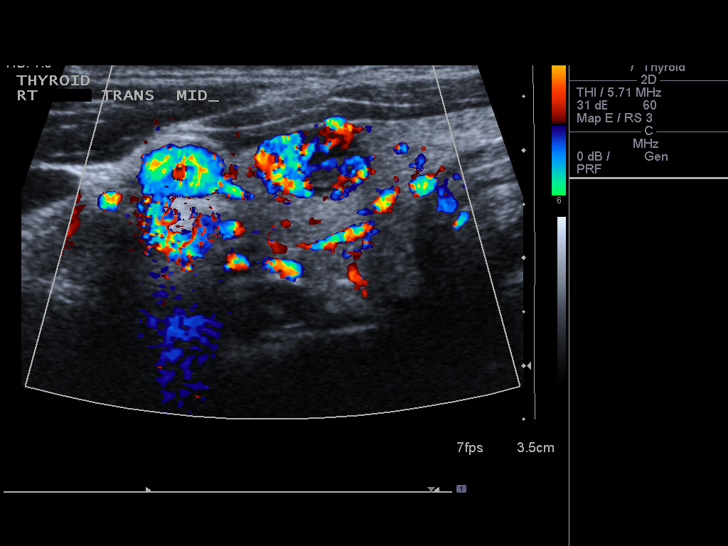
[im 8/31]
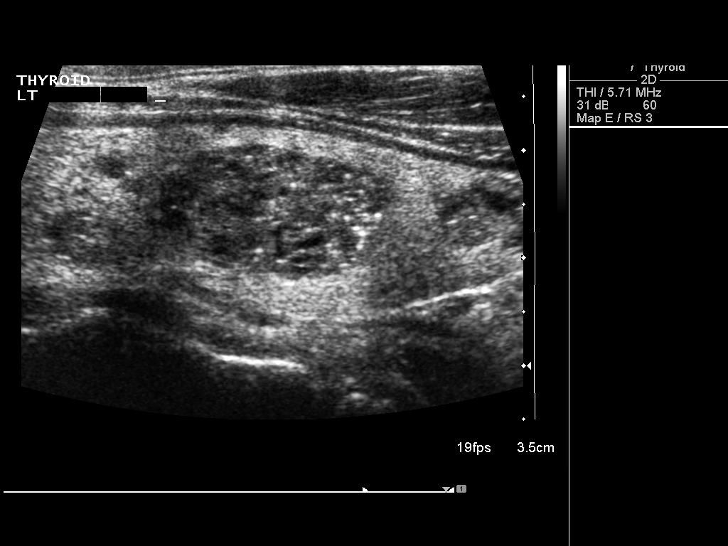
[im 11/31]
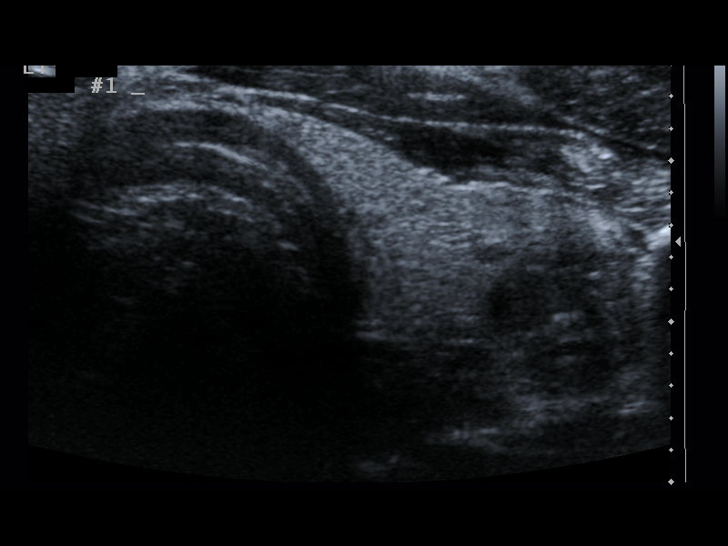
[im 12/31]
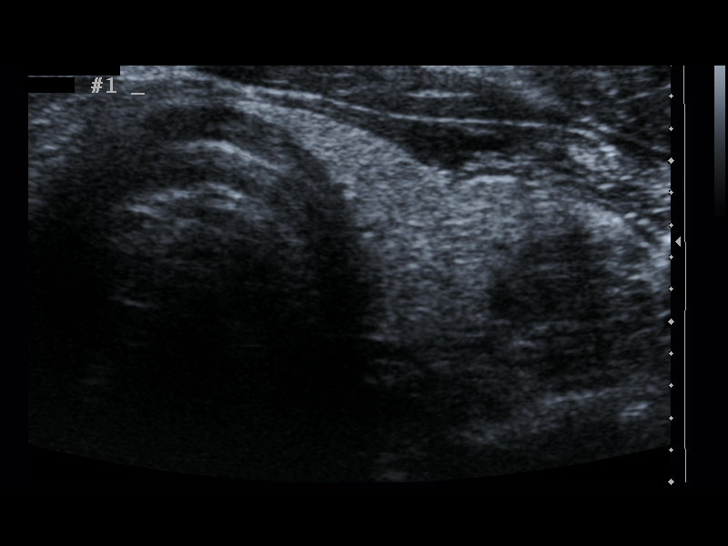
[im 14/31]
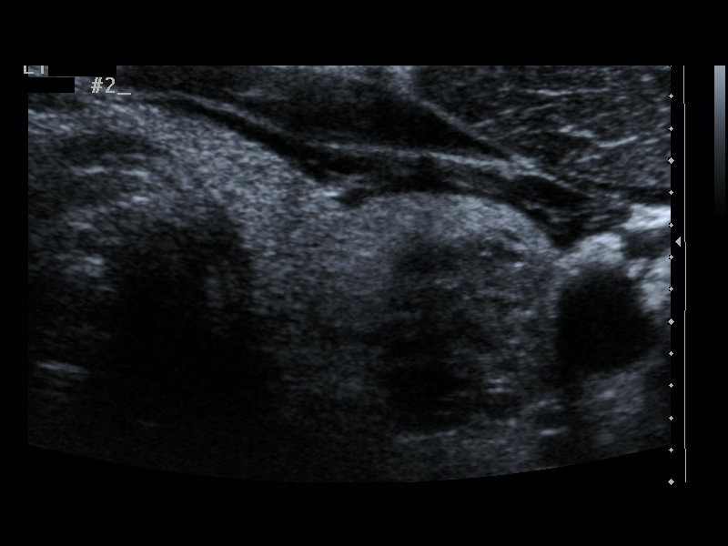
[im 17/31]
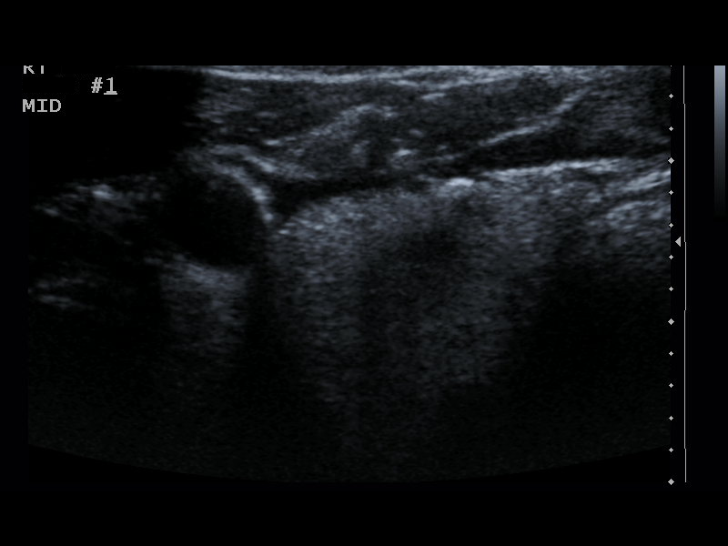
[im 19/31]
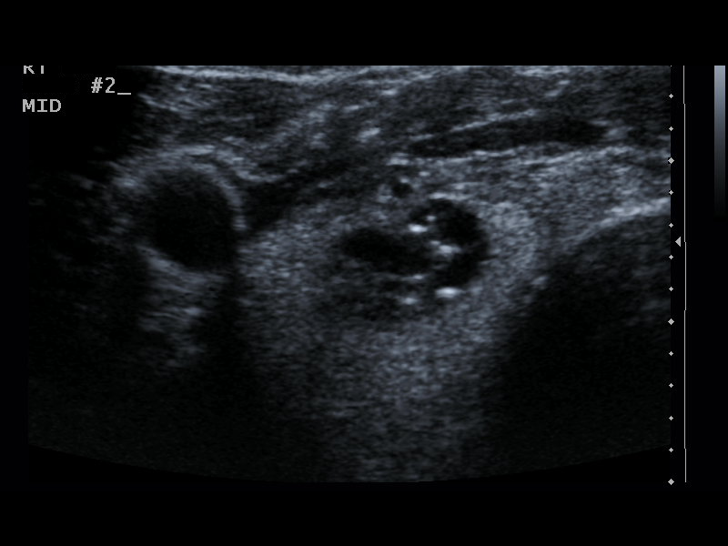
[im 21/31]
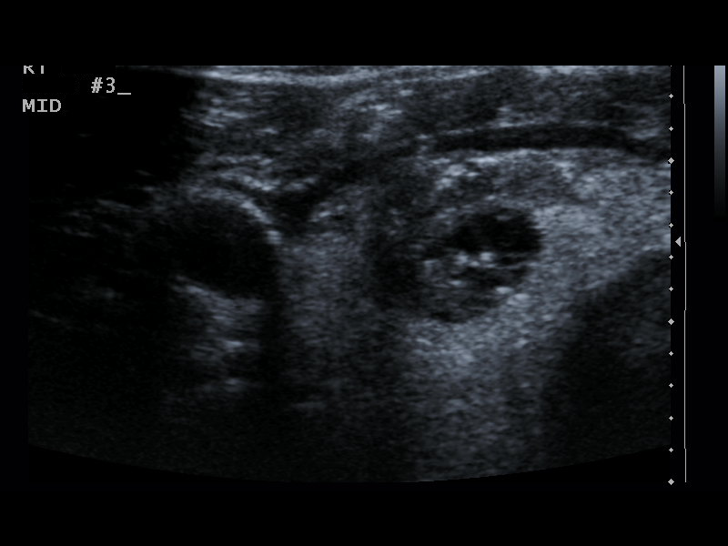
[im 23/31]
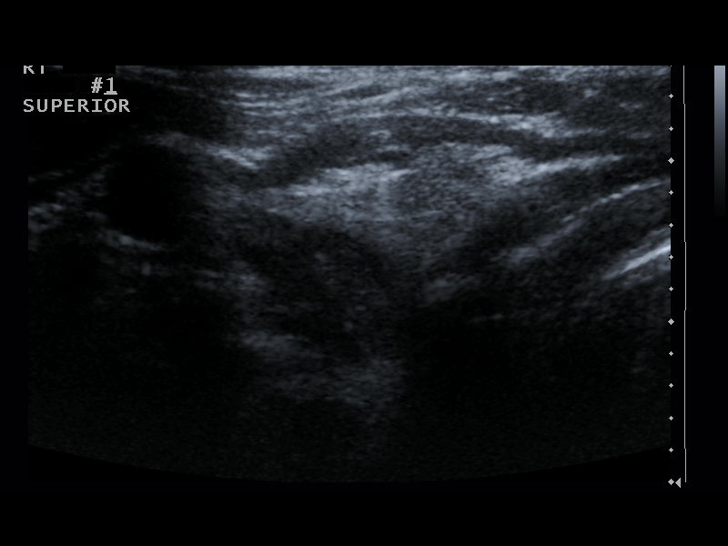
[im 26/31]
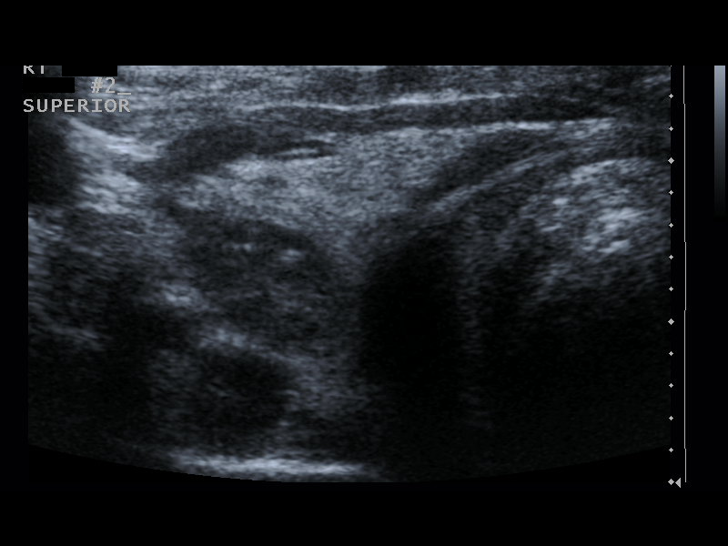
[im 28/31]
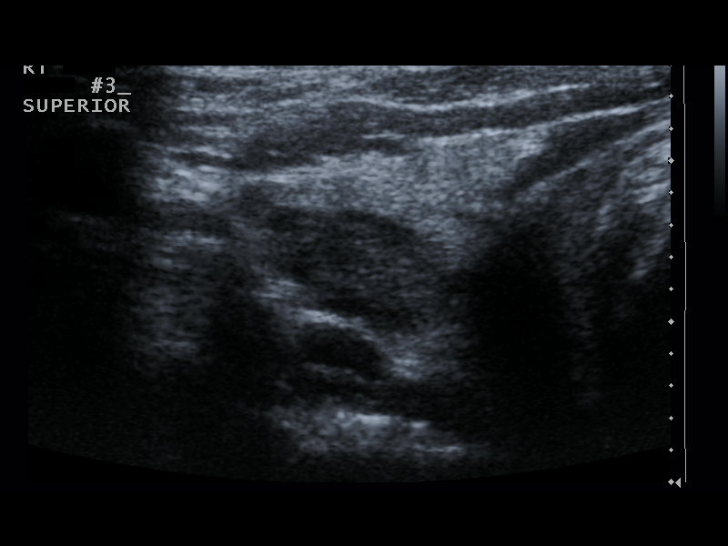
[im 31/31]
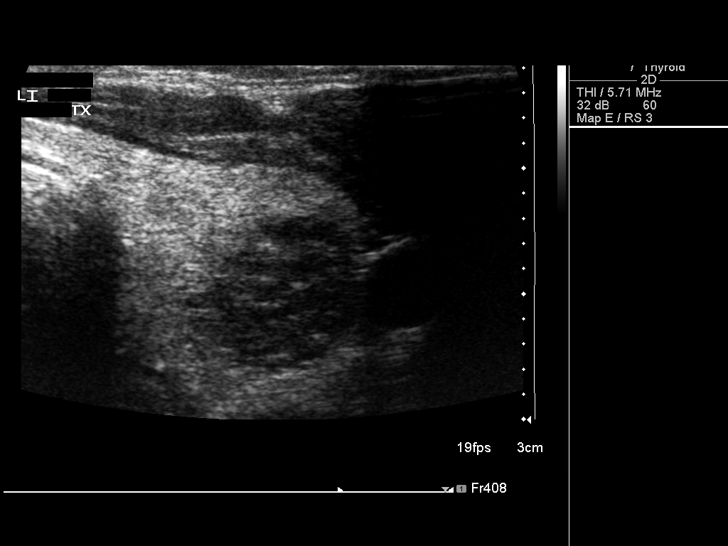

[14 of 25 positions shown; findings below may reference images not displayed]

FINDINGS: The images document guide needle placement within the
three described thyroid nodules. Post biopsy images demonstrate no
hemorrhage.
IMPRESSION: Successful ultrasound-guided fine needle aspiration of three
thyroid nodules.

## 2013-12-13 ENCOUNTER — Other Ambulatory Visit: Payer: Self-pay | Admitting: Endocrinology

## 2013-12-13 DIAGNOSIS — E041 Nontoxic single thyroid nodule: Secondary | ICD-10-CM

## 2014-02-17 ENCOUNTER — Other Ambulatory Visit: Payer: Self-pay | Admitting: Obstetrics and Gynecology

## 2014-02-17 DIAGNOSIS — R921 Mammographic calcification found on diagnostic imaging of breast: Secondary | ICD-10-CM

## 2014-02-27 ENCOUNTER — Encounter (INDEPENDENT_AMBULATORY_CARE_PROVIDER_SITE_OTHER): Payer: Self-pay

## 2014-02-27 ENCOUNTER — Ambulatory Visit
Admission: RE | Admit: 2014-02-27 | Discharge: 2014-02-27 | Disposition: A | Discharge status: Home / Self Care | Payer: 59 | Source: Ambulatory Visit | Attending: Obstetrics and Gynecology | Admitting: Obstetrics and Gynecology

## 2014-02-27 DIAGNOSIS — R921 Mammographic calcification found on diagnostic imaging of breast: Secondary | ICD-10-CM

## 2014-06-02 ENCOUNTER — Other Ambulatory Visit: Payer: Self-pay | Admitting: Obstetrics and Gynecology

## 2014-06-05 ENCOUNTER — Other Ambulatory Visit: Payer: Self-pay | Admitting: Obstetrics and Gynecology

## 2014-06-05 DIAGNOSIS — R921 Mammographic calcification found on diagnostic imaging of breast: Secondary | ICD-10-CM

## 2014-06-05 LAB — CYTOLOGY - PAP

## 2014-06-19 ENCOUNTER — Other Ambulatory Visit: Payer: Self-pay | Admitting: Obstetrics and Gynecology

## 2014-06-19 ENCOUNTER — Ambulatory Visit
Admission: RE | Admit: 2014-06-19 | Discharge: 2014-06-19 | Disposition: A | Discharge status: Home / Self Care | Payer: 59 | Source: Ambulatory Visit | Attending: Obstetrics and Gynecology | Admitting: Obstetrics and Gynecology

## 2014-06-19 DIAGNOSIS — R921 Mammographic calcification found on diagnostic imaging of breast: Secondary | ICD-10-CM

## 2014-06-19 DIAGNOSIS — N6320 Unspecified lump in the left breast, unspecified quadrant: Secondary | ICD-10-CM

## 2014-06-19 DIAGNOSIS — N6310 Unspecified lump in the right breast, unspecified quadrant: Secondary | ICD-10-CM

## 2014-07-06 NOTE — Patient Instructions (Addendum)
   Your procedure is scheduled on:  Friday, Jan 29   Enter through the Main Entrance of [facility] at: 11:30 AM Pick up the phone at the desk and dial [phone] and inform us of your arrival.  Please call this number if you have any problems the morning of surgery: [phone]  Remember: Do not eat after midnight: Thursday Do not drink after 9 AM Friday, day of surgery Take these medicines the morning of surgery with a SIP OF WATER: synthroid and budesonide.  Bring albuterol inhaler with you on day of surgery  Do not wear jewelry, make-up, or FINGER nail polish No metal in your hair or on your body. Do not wear lotions, powders, perfumes.  You may wear deodorant.  Do not bring valuables to the hospital. Contacts, dentures or bridgework may not be worn into surgery.  Patients discharged on the day of surgery will not be allowed to drive home.  Home with husband [name] [name] cell [phone]

## 2014-07-07 ENCOUNTER — Encounter (HOSPITAL_COMMUNITY)
Admission: RE | Admit: 2014-07-07 | Discharge: 2014-07-07 | Disposition: A | Discharge status: Home / Self Care | Payer: 59 | Source: Ambulatory Visit | Attending: Obstetrics and Gynecology | Admitting: Obstetrics and Gynecology

## 2014-07-07 ENCOUNTER — Encounter (HOSPITAL_COMMUNITY): Payer: Self-pay

## 2014-07-07 DIAGNOSIS — Z01818 Encounter for other preprocedural examination: Secondary | ICD-10-CM | POA: Diagnosis present

## 2014-07-07 HISTORY — DX: Nausea with vomiting, unspecified: R11.2

## 2014-07-07 HISTORY — DX: Other specified postprocedural states: Z98.890

## 2014-07-07 HISTORY — DX: Hypothyroidism, unspecified: E03.9

## 2014-07-07 LAB — CBC
HCT: 41.9 % (ref 36.0–46.0)
Hemoglobin: 13.7 g/dL (ref 12.0–15.0)
MCH: 29.1 pg (ref 26.0–34.0)
MCHC: 32.7 g/dL (ref 30.0–36.0)
MCV: 89 fL (ref 78.0–100.0)
Platelets: 243 10*3/uL (ref 150–400)
RBC: 4.71 MIL/uL (ref 3.87–5.11)
RDW: 12.5 % (ref 11.5–15.5)
WBC: 6.9 10*3/uL (ref 4.0–10.5)

## 2014-07-13 NOTE — Anesthesia Preprocedure Evaluation (Addendum)
Anesthesia Evaluation  Patient identified by MRN, date of birth, ID band Patient awake    Reviewed: Allergy & Precautions, NPO status , Patient's Chart, lab work & pertinent test results  History of Anesthesia Complications (+) PONV and history of anesthetic complications  Airway Mallampati: III  TM Distance: >3 FB Neck ROM: Full    Dental no notable dental hx. (+) Dental Advisory Given, Teeth Intact   Pulmonary asthma , pneumonia -, resolved,  breath sounds clear to auscultation  Pulmonary exam normal       Cardiovascular negative cardio ROS  + Valvular Problems/Murmurs Rhythm:Regular Rate:Normal     Neuro/Psych negative neurological ROS  negative psych ROS   GI/Hepatic Neg liver ROS, GERD-  Medicated and Controlled,  Endo/Other  Hypothyroidism Obesity  Renal/GU negative Renal ROS  negative genitourinary   Musculoskeletal  (+) Arthritis -, Osteoarthritis,    Abdominal (+) + obese,   Peds negative pediatric ROS (+)  Hematology negative hematology ROS (+)   Anesthesia Other Findings   Reproductive/Obstetrics negative OB ROS Desires sterilization                            Anesthesia Physical Anesthesia Plan  ASA: II  Anesthesia Plan: General   Post-op Pain Management:    Induction: Intravenous  Airway Management Planned: Oral ETT  Additional Equipment:   Intra-op Plan:   Post-operative Plan: Extubation in OR  Informed Consent: I have reviewed the patients History and Physical, chart, labs and discussed the procedure including the risks, benefits and alternatives for the proposed anesthesia with the patient or authorized representative who has indicated his/her understanding and acceptance.   Dental advisory given  Plan Discussed with: CRNA, Anesthesiologist and Surgeon  Anesthesia Plan Comments:        Anesthesia Quick Evaluation

## 2014-07-14 ENCOUNTER — Ambulatory Visit (HOSPITAL_COMMUNITY): Payer: 59 | Admitting: Anesthesiology

## 2014-07-14 ENCOUNTER — Encounter (HOSPITAL_COMMUNITY)
Admission: RE | Disposition: A | Discharge status: Home / Self Care | Payer: Self-pay | Source: Ambulatory Visit | Attending: Obstetrics and Gynecology

## 2014-07-14 ENCOUNTER — Encounter (HOSPITAL_COMMUNITY): Payer: Self-pay | Admitting: Anesthesiology

## 2014-07-14 ENCOUNTER — Ambulatory Visit (HOSPITAL_COMMUNITY)
Admission: RE | Admit: 2014-07-14 | Discharge: 2014-07-14 | Disposition: A | Discharge status: Home / Self Care | Payer: 59 | Source: Ambulatory Visit | Attending: Obstetrics and Gynecology | Admitting: Obstetrics and Gynecology

## 2014-07-14 DIAGNOSIS — R072 Precordial pain: Secondary | ICD-10-CM | POA: Diagnosis not present

## 2014-07-14 DIAGNOSIS — E871 Hypo-osmolality and hyponatremia: Secondary | ICD-10-CM | POA: Insufficient documentation

## 2014-07-14 DIAGNOSIS — K3580 Unspecified acute appendicitis: Secondary | ICD-10-CM | POA: Insufficient documentation

## 2014-07-14 DIAGNOSIS — K219 Gastro-esophageal reflux disease without esophagitis: Secondary | ICD-10-CM | POA: Diagnosis not present

## 2014-07-14 DIAGNOSIS — Z6833 Body mass index (BMI) 33.0-33.9, adult: Secondary | ICD-10-CM | POA: Insufficient documentation

## 2014-07-14 DIAGNOSIS — M1990 Unspecified osteoarthritis, unspecified site: Secondary | ICD-10-CM | POA: Insufficient documentation

## 2014-07-14 DIAGNOSIS — J320 Chronic maxillary sinusitis: Secondary | ICD-10-CM | POA: Insufficient documentation

## 2014-07-14 DIAGNOSIS — J45901 Unspecified asthma with (acute) exacerbation: Secondary | ICD-10-CM | POA: Insufficient documentation

## 2014-07-14 DIAGNOSIS — E039 Hypothyroidism, unspecified: Secondary | ICD-10-CM | POA: Diagnosis not present

## 2014-07-14 DIAGNOSIS — E876 Hypokalemia: Secondary | ICD-10-CM | POA: Diagnosis not present

## 2014-07-14 DIAGNOSIS — Z302 Encounter for sterilization: Secondary | ICD-10-CM | POA: Insufficient documentation

## 2014-07-14 DIAGNOSIS — E669 Obesity, unspecified: Secondary | ICD-10-CM | POA: Insufficient documentation

## 2014-07-14 HISTORY — PX: LAPAROSCOPIC TUBAL LIGATION: SHX1937

## 2014-07-14 LAB — PREGNANCY, URINE: Preg Test, Ur: NEGATIVE

## 2014-07-14 SURGERY — LIGATION, FALLOPIAN TUBE, LAPAROSCOPIC
Anesthesia: General | Laterality: Bilateral

## 2014-07-14 MED ORDER — PROPOFOL 10 MG/ML IV BOLUS
INTRAVENOUS | Status: DC | PRN
  Administered 2014-07-14: 150 mg via INTRAVENOUS

## 2014-07-14 MED ORDER — FENTANYL CITRATE 0.05 MG/ML IJ SOLN: 25.0000 ug | INTRAMUSCULAR | Status: DC | PRN

## 2014-07-14 MED ORDER — BUPIVACAINE HCL (PF) 0.25 % IJ SOLN
INTRAMUSCULAR | Status: AC
  Filled 2014-07-14: qty 30

## 2014-07-14 MED ORDER — ONDANSETRON HCL 4 MG/2ML IJ SOLN
INTRAMUSCULAR | Status: DC | PRN
  Administered 2014-07-14: 4 mg via INTRAVENOUS

## 2014-07-14 MED ORDER — DEXAMETHASONE SODIUM PHOSPHATE 10 MG/ML IJ SOLN
INTRAMUSCULAR | Status: AC
  Filled 2014-07-14: qty 1

## 2014-07-14 MED ORDER — SODIUM CHLORIDE 0.9 % IJ SOLN
INTRAMUSCULAR | Status: AC
  Filled 2014-07-14: qty 10

## 2014-07-14 MED ORDER — FENTANYL CITRATE 0.05 MG/ML IJ SOLN
INTRAMUSCULAR | Status: DC | PRN
  Administered 2014-07-14 (×3): 50 ug via INTRAVENOUS

## 2014-07-14 MED ORDER — LACTATED RINGERS IV SOLN: INTRAVENOUS | Status: DC

## 2014-07-14 MED ORDER — SCOPOLAMINE 1 MG/3DAYS TD PT72
1.0000 | MEDICATED_PATCH | Freq: Once | TRANSDERMAL | Status: DC
  Administered 2014-07-14: 1.5 mg via TRANSDERMAL

## 2014-07-14 MED ORDER — NEOSTIGMINE METHYLSULFATE 10 MG/10ML IV SOLN
INTRAVENOUS | Status: AC
  Filled 2014-07-14: qty 1

## 2014-07-14 MED ORDER — PROPOFOL 10 MG/ML IV BOLUS
INTRAVENOUS | Status: AC
  Filled 2014-07-14: qty 20

## 2014-07-14 MED ORDER — OXYCODONE-ACETAMINOPHEN 10-325 MG PO TABS: 1.0000 | ORAL_TABLET | ORAL | Status: DC | PRN

## 2014-07-14 MED ORDER — LACTATED RINGERS IV SOLN
INTRAVENOUS | Status: DC
  Administered 2014-07-14 (×2): via INTRAVENOUS

## 2014-07-14 MED ORDER — DEXAMETHASONE SODIUM PHOSPHATE 10 MG/ML IJ SOLN
INTRAMUSCULAR | Status: DC | PRN
  Administered 2014-07-14: 10 mg via INTRAVENOUS

## 2014-07-14 MED ORDER — ONDANSETRON HCL 4 MG/2ML IJ SOLN: 4.0000 mg | Freq: Once | INTRAMUSCULAR | Status: DC | PRN

## 2014-07-14 MED ORDER — KETOROLAC TROMETHAMINE 30 MG/ML IJ SOLN
INTRAMUSCULAR | Status: DC | PRN
  Administered 2014-07-14: 30 mg via INTRAVENOUS

## 2014-07-14 MED ORDER — NEOSTIGMINE METHYLSULFATE 10 MG/10ML IV SOLN
INTRAVENOUS | Status: DC | PRN
  Administered 2014-07-14: 3 mg via INTRAVENOUS

## 2014-07-14 MED ORDER — ROCURONIUM BROMIDE 100 MG/10ML IV SOLN
INTRAVENOUS | Status: AC
  Filled 2014-07-14: qty 1

## 2014-07-14 MED ORDER — GLYCOPYRROLATE 0.2 MG/ML IJ SOLN
INTRAMUSCULAR | Status: AC
Start: 2014-07-14 — End: 2014-07-14
  Filled 2014-07-14: qty 3

## 2014-07-14 MED ORDER — LIDOCAINE HCL (CARDIAC) 20 MG/ML IV SOLN
INTRAVENOUS | Status: DC | PRN
  Administered 2014-07-14: 80 mg via INTRAVENOUS

## 2014-07-14 MED ORDER — IBUPROFEN 200 MG PO TABS: 600.0000 mg | ORAL_TABLET | Freq: Four times a day (QID) | ORAL | Status: DC | PRN

## 2014-07-14 MED ORDER — DIPHENHYDRAMINE HCL 50 MG/ML IJ SOLN
INTRAMUSCULAR | Status: DC | PRN
  Administered 2014-07-14: 12.5 mg via INTRAVENOUS

## 2014-07-14 MED ORDER — ROCURONIUM BROMIDE 100 MG/10ML IV SOLN
INTRAVENOUS | Status: DC | PRN
  Administered 2014-07-14: 30 mg via INTRAVENOUS

## 2014-07-14 MED ORDER — MIDAZOLAM HCL 2 MG/2ML IJ SOLN
INTRAMUSCULAR | Status: DC | PRN
  Administered 2014-07-14: 2 mg via INTRAVENOUS

## 2014-07-14 MED ORDER — BUPIVACAINE HCL (PF) 0.25 % IJ SOLN
INTRAMUSCULAR | Status: DC | PRN
Start: 2014-07-14 — End: 2014-07-14
  Administered 2014-07-14: 6 mL

## 2014-07-14 MED ORDER — GLYCOPYRROLATE 0.2 MG/ML IJ SOLN
INTRAMUSCULAR | Status: DC | PRN
  Administered 2014-07-14: 0.6 mg via INTRAVENOUS

## 2014-07-14 MED ORDER — METRONIDAZOLE IN NACL 5-0.79 MG/ML-% IV SOLN
500.0000 mg | Freq: Once | INTRAVENOUS | Status: AC
  Administered 2014-07-14: 500 mg via INTRAVENOUS
  Filled 2014-07-14: qty 100

## 2014-07-14 MED ORDER — FENTANYL CITRATE 0.05 MG/ML IJ SOLN
INTRAMUSCULAR | Status: AC
  Filled 2014-07-14: qty 5

## 2014-07-14 MED ORDER — MIDAZOLAM HCL 2 MG/2ML IJ SOLN
INTRAMUSCULAR | Status: AC
  Filled 2014-07-14: qty 2

## 2014-07-14 MED ORDER — SCOPOLAMINE 1 MG/3DAYS TD PT72
MEDICATED_PATCH | TRANSDERMAL | Status: AC
  Filled 2014-07-14: qty 1

## 2014-07-14 MED ORDER — ONDANSETRON HCL 4 MG/2ML IJ SOLN
INTRAMUSCULAR | Status: AC
  Filled 2014-07-14: qty 2

## 2014-07-14 MED ORDER — LIDOCAINE HCL (CARDIAC) 20 MG/ML IV SOLN
INTRAVENOUS | Status: AC
  Filled 2014-07-14: qty 5

## 2014-07-14 MED ORDER — KETOROLAC TROMETHAMINE 30 MG/ML IJ SOLN
INTRAMUSCULAR | Status: AC
  Filled 2014-07-14: qty 1

## 2014-07-14 SURGICAL SUPPLY — 21 items
CATH ROBINSON RED A/P 16FR (CATHETERS) ×2 IMPLANT
CHLORAPREP W/TINT 26ML (MISCELLANEOUS) ×2 IMPLANT
CLOTH BEACON ORANGE TIMEOUT ST (SAFETY) ×2 IMPLANT
DRSG COVADERM PLUS 2X2 (GAUZE/BANDAGES/DRESSINGS) IMPLANT
DRSG OPSITE POSTOP 3X4 (GAUZE/BANDAGES/DRESSINGS) ×4 IMPLANT
GLOVE BIO SURGEON STRL SZ 6.5 (GLOVE) ×2 IMPLANT
GLOVE BIO SURGEON STRL SZ7 (GLOVE) ×4 IMPLANT
GLOVE BIOGEL PI IND STRL 7.0 (GLOVE) ×1 IMPLANT
GLOVE BIOGEL PI INDICATOR 7.0 (GLOVE) ×1
GOWN STRL REUS W/TWL LRG LVL3 (GOWN DISPOSABLE) ×4 IMPLANT
LIQUID BAND (GAUZE/BANDAGES/DRESSINGS) ×2 IMPLANT
NEEDLE INSUFFLATION 120MM (ENDOMECHANICALS) ×2 IMPLANT
PACK LAPAROSCOPY BASIN (CUSTOM PROCEDURE TRAY) ×2 IMPLANT
PAD POSITIONER PINK NONSTERILE (MISCELLANEOUS) ×2 IMPLANT
SUT VIC AB 3-0 PS2 18 (SUTURE) ×1
SUT VIC AB 3-0 PS2 18XBRD (SUTURE) ×1 IMPLANT
SUT VICRYL 0 UR6 27IN ABS (SUTURE) ×2 IMPLANT
TOWEL OR 17X24 6PK STRL BLUE (TOWEL DISPOSABLE) ×4 IMPLANT
TROCAR XCEL DIL TIP R 11M (ENDOMECHANICALS) ×2 IMPLANT
WARMER LAPAROSCOPE (MISCELLANEOUS) ×2 IMPLANT
WATER STERILE IRR 1000ML POUR (IV SOLUTION) ×2 IMPLANT

## 2014-07-14 NOTE — Transfer of Care (Signed)
Immediate Anesthesia Transfer of Care Note  Patient: [name] [name]  Procedure(s) Performed: Procedure(s): LAPAROSCOPIC TUBAL LIGATION (Bilateral)  Patient Location: PACU  Anesthesia Type:General  Level of Consciousness: awake, alert  and oriented  Airway & Oxygen Therapy: Patient Spontanous Breathing and Patient connected to nasal cannula oxygen  Post-op Assessment: Report given to RN, Post -op Vital signs reviewed and stable and Patient moving all extremities  Post vital signs: Reviewed and stable  Last Vitals:  Filed Vitals:   07/14/14 1143  BP: 130/76  Pulse: 64  Temp: 36.8 C  Resp: 18    Complications: No apparent anesthesia complications

## 2014-07-14 NOTE — Anesthesia Procedure Notes (Signed)
Procedure Name: Intubation Date/Time: 07/14/2014 2:03 PM Performed by: Sabra Heck L Pre-anesthesia Checklist: Timeout performed, Patient identified, Emergency Drugs available, Suction available and Patient being monitored Patient Re-evaluated:Patient Re-evaluated prior to inductionOxygen Delivery Method: Circle system utilized Preoxygenation: Pre-oxygenation with 100% oxygen Intubation Type: IV induction Ventilation: Mask ventilation without difficulty Laryngoscope Size: Mac and 3 Grade View: Grade I Tube type: Oral Tube size: 7.0 mm Number of attempts: 1 Airway Equipment and Method: Stylet Placement Confirmation: ETT inserted through vocal cords under direct vision,  positive ETCO2,  CO2 detector and breath sounds checked- equal and bilateral Secured at: 20 cm Tube secured with: Tape Dental Injury: Teeth and Oropharynx as per pre-operative assessment

## 2014-07-14 NOTE — Discharge Instructions (Signed)
DISCHARGE INSTRUCTIONS: Laparoscopy  The following instructions have been prepared to help you care for yourself upon your return home today.  May remove Scop patch on or before 07/16/2014.  May take Ibuprofen products after 8:30 pm as needed for pain.  Wound care:  Do not get the incision wet for the first 24 hours. The incision should be kept clean and dry.  The dressing may be removed 48 hours after surgery.  Should the incision become sore, red, and swollen after the first week, check with your doctor.  Personal hygiene:  Shower the day after your procedure.  Activity and limitations:  Do NOT drive or operate any equipment today.  Do NOT lift anything more than 15 pounds for 2-3 weeks after surgery.  Do NOT rest in bed all day.  Walking is encouraged. Walk each day, starting slowly with 5-minute walks 3 or 4 times a day. Slowly increase the length of your walks.  Walk up and down stairs slowly.  Do NOT do strenuous activities, such as golfing, playing tennis, bowling, running, biking, weight lifting, gardening, mowing, or vacuuming for 2-4 weeks. Ask your doctor when it is okay to start.  Diet: Eat a light meal as desired this evening. You may resume your usual diet tomorrow.  Return to work: This is dependent on the type of work you do. For the most part you can return to a desk job within a week of surgery. If you are more active at work, please discuss this with your doctor.  What to expect after your surgery: You may have a slight burning sensation when you urinate on the first day. You may have a very small amount of blood in the urine. Expect to have a small amount of vaginal discharge/light bleeding for 1-2 weeks. It is not unusual to have abdominal soreness and bruising for up to 2 weeks. You may be tired and need more rest for about 1 week. You may experience shoulder pain for 24-72 hours. Lying flat in bed may relieve it.  Call your doctor for any of the  following:  Develop a fever of 100.4 or greater  Inability to urinate 6 hours after discharge from hospital  Severe pain not relieved by pain medications  Persistent of heavy bleeding at incision site  Redness or swelling around incision site after a week  Increasing nausea or vomiting

## 2014-07-14 NOTE — Progress Notes (Signed)
H and P on the chart No significant changes Will proceed with [facility] BTL Consent signed

## 2014-07-14 NOTE — Anesthesia Postprocedure Evaluation (Signed)
  Anesthesia Post-op Note  Patient: [name] [name]  Procedure(s) Performed: Procedure(s): LAPAROSCOPIC TUBAL LIGATION (Bilateral)  Patient Location: PACU  Anesthesia Type:General  Level of Consciousness: awake, alert  and oriented  Airway and Oxygen Therapy: Patient Spontanous Breathing  Post-op Pain: none  Post-op Assessment: Post-op Vital signs reviewed, Patient's Cardiovascular Status Stable, Respiratory Function Stable, Patent Airway, No signs of Nausea or vomiting and Pain level controlled  Post-op Vital Signs: Reviewed and stable  Last Vitals:  Filed Vitals:   07/14/14 1143  BP: 130/76  Pulse: 64  Temp: 36.8 C  Resp: 18    Complications: No apparent anesthesia complications

## 2014-07-14 NOTE — H&P (Signed)
  44 year old female desires permanent permanent sterilization.  History of Appendectomy.  Also history of multiple allergies  Denies any gyn complaints  Afebrile VSS  Allergies  Allergen Reactions  . Amoxicillin-Pot Clavulanate Anaphylaxis  . Ceftriaxone Sodium Anaphylaxis  . Quinolones Anaphylaxis  . Vancomycin Hives  . Erythromycin Diarrhea and Nausea And Vomiting  . Azithromycin Hives and Rash  . Morphine Itching  . Sulfa Antibiotics Hives  . Tape Rash    Adhesive tape - blisters    Patient Active Problem List   Diagnosis Date Noted  . Fever 01/15/2013  . Chronic maxillary sinusitis 01/15/2013  . Hyponatremia 01/15/2013  . Hypokalemia 01/15/2013  . Appendicitis, acute 10/22/2011  . ASTHMA UNSPECIFIED WITH EXACERBATION 07/30/2010  . COUGH 04/08/2010  . CHEST PAIN, PRECORDIAL 04/08/2010   No results found for this or any previous visit (from the past 24 hour(s)). Past Surgical History  Procedure Laterality Date  . 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 Nasal sinus surgery  2008    X 4, Revision right total ethmoidectomy with right frontal recess expliration, left frontal recess exploration, bilateral sphenoidotomies  . Cystectomy      vaginal cyst  . Laparoscopic appendectomy  10/22/2011    Procedure: APPENDECTOMY LAPAROSCOPIC;  Surgeon: Adin Hector, MD;  Location: [facility] ORS;  Service: General;  Laterality: N/A;  . Appendectomy  10/22/11  . Wisdom tooth extraction    . Tibia fracture surgery  1994    tumor removed      General alert and oriented Lung CTAB Car RRR Abdomen is soft and non tender Pelvic Normal  IMPRESSION:  Desires Permanent sterilization Proceed with [facility] BTL Consent signed Risks reviewed

## 2014-07-14 NOTE — Brief Op Note (Signed)
07/14/2014  2:38 PM  PATIENT:  Shela Leff  44 y.o. female  PRE-OPERATIVE DIAGNOSIS:  Desires permanent sterilization  POST-OPERATIVE DIAGNOSIS:  Same  PROCEDURE:  Procedure(s): LAPAROSCOPIC TUBAL LIGATION (Bilateral)  SURGEON:  Surgeon(s) and Role:    * Cyril Mourning, MD - Primary  PHYSICIAN ASSISTANT:   ASSISTANTS: none   ANESTHESIA:   general  EBL:  Total I/O In: 1000 [I.V.:1000] Out: 30 [Urine:30]  BLOOD ADMINISTERED:none  DRAINS: none   LOCAL MEDICATIONS USED:  NONE  SPECIMEN: none  DISPOSITION OF SPECIMEN:  N/A  COUNTS:  YES  TOURNIQUET:  * No tourniquets in log *  DICTATION: .Other Dictation: Dictation Number [id number]  PLAN OF CARE: Discharge to home after PACU  PATIENT DISPOSITION:  PACU - hemodynamically stable.   Delay start of Pharmacological VTE agent (>24hrs) due to surgical blood loss or risk of bleeding: not applicable

## 2014-07-15 NOTE — Op Note (Signed)
[name], [name] [name]               ACCOUNT NO.:  [id number]  MEDICAL RECORD NO.:  [id number]  LOCATION:  [facility]                          FACILITY:  [facility]  PHYSICIAN:  Shamon Lobo L. Novalie Leamy, M.D.DATE OF BIRTH:  02/17/1971  DATE OF PROCEDURE:  07/14/2014 DATE OF DISCHARGE:  07/14/2014                              OPERATIVE REPORT   PREOPERATIVE DIAGNOSIS:  Desires permanent sterilization.  POSTOPERATIVE DIAGNOSIS:  Desires permanent sterilization.  PROCEDURE:  Laparoscopic bilateral tubal ligation.  SURGEON:  Galya Dunnigan L. Helane Rima, M.D.  ANESTHESIA:  General.  ESTIMATED BLOOD LOSS:  Minimal.  COMPLICATIONS:  None.  PATHOLOGY:  None.  DRAINS:  None.  DESCRIPTION OF PROCEDURE:  The patient was taken to the operating room. She was intubated.  She was prepped and draped.  A uterine manipulator was inserted and in-and-out catheter was used to empty the bladder.  The speculum was inserted to the vagina.  Attention was turned to the abdomen where a small infraumbilical incision was made and the Veress needle was inserted.  Pneumoperitoneum was performed and the Veress needle was removed and the 11 mm trocar was inserted.  The laparoscope was introduced into the trocar sheath and the patient was placed in Trendelenburg position.  There was no area of abdominal bleeding or intestinal injury.  The uterus was visualized, it was noted to be normal.  The adnexa were normal.  Using the long Kleppinger, we inserted through the operative scope and then elevated the fallopian tube on the left, followed it out to the fimbriated end.  I then grasped the mid portion of the tube and performed a tubal ligation using the triple-burn technique.  We did this on the right side in similar fashion.  After the bilateral tubal ligation was performed, the pneumoperitoneum was released.  The trocar was removed.  The skin stitch was placed with interrupted using 3-0 Vicryl interrupted dressing and Dermabond  was applied.  All sponge, lap, and instrument counts were correct x2.  The patient went to recovery room in stable condition.     Chana Lindstrom L. Helane Rima, M.D.     Nevin Bloodgood  D:  07/14/2014  T:  07/15/2014  Job:  [id number]

## 2014-07-17 ENCOUNTER — Encounter (HOSPITAL_COMMUNITY): Payer: Self-pay | Admitting: Obstetrics and Gynecology

## 2014-10-23 ENCOUNTER — Inpatient Hospital Stay: Admission: RE | Admit: 2014-10-23 | Payer: 59 | Source: Ambulatory Visit

## 2015-09-03 ENCOUNTER — Other Ambulatory Visit: Payer: Self-pay | Admitting: Obstetrics and Gynecology

## 2015-09-03 ENCOUNTER — Other Ambulatory Visit: Payer: Self-pay | Admitting: Endocrinology

## 2015-09-03 DIAGNOSIS — E041 Nontoxic single thyroid nodule: Secondary | ICD-10-CM

## 2015-09-03 DIAGNOSIS — N6310 Unspecified lump in the right breast, unspecified quadrant: Secondary | ICD-10-CM

## 2015-09-05 ENCOUNTER — Other Ambulatory Visit: Payer: 59

## 2015-09-07 ENCOUNTER — Ambulatory Visit
Admission: RE | Admit: 2015-09-07 | Discharge: 2015-09-07 | Disposition: A | Discharge status: Home / Self Care | Payer: 59 | Source: Ambulatory Visit | Attending: Obstetrics and Gynecology | Admitting: Obstetrics and Gynecology

## 2015-09-07 DIAGNOSIS — N6310 Unspecified lump in the right breast, unspecified quadrant: Secondary | ICD-10-CM

## 2015-09-14 ENCOUNTER — Inpatient Hospital Stay: Admission: RE | Admit: 2015-09-14 | Payer: 59 | Source: Ambulatory Visit

## 2015-09-17 ENCOUNTER — Other Ambulatory Visit: Payer: 59

## 2015-10-12 ENCOUNTER — Other Ambulatory Visit: Payer: 59

## 2015-10-22 ENCOUNTER — Ambulatory Visit
Admission: RE | Admit: 2015-10-22 | Discharge: 2015-10-22 | Disposition: A | Discharge status: Home / Self Care | Payer: 59 | Source: Ambulatory Visit | Attending: Endocrinology | Admitting: Endocrinology

## 2015-10-22 DIAGNOSIS — E041 Nontoxic single thyroid nodule: Secondary | ICD-10-CM

## 2016-01-18 ENCOUNTER — Institutional Professional Consult (permissible substitution): Payer: 59 | Admitting: Pulmonary Disease

## 2016-03-28 ENCOUNTER — Institutional Professional Consult (permissible substitution): Payer: 59 | Admitting: Pulmonary Disease

## 2016-06-25 ENCOUNTER — Telehealth: Payer: Self-pay

## 2016-06-25 NOTE — Telephone Encounter (Signed)
Sent notes to scheduling 

## 2016-06-30 ENCOUNTER — Telehealth: Payer: Self-pay | Admitting: Cardiology

## 2016-06-30 NOTE — Telephone Encounter (Signed)
Received records from [facility] for appointment on 07/18/16 with Dr Stanford Breed.  Records put with Dr Jacalyn Lefevre records for 07/18/16. lp

## 2016-07-10 NOTE — Progress Notes (Signed)
HPI: 46 year old female for evaluation of mitral valve prolapse. Echocardiogram August 2014 showed normal LV function, mild left atrial enlargement and mild mitral regurgitation. Patient denies dyspnea on exertion, orthopnea, PND, pedal edema, recent syncope or chest pain. She occasionally has palpitations described as fluttering for approximately 5 seconds. No associated symptoms. She has had these intermittently for years and has worn monitors in the past. However they were unable to capture events.  Current Outpatient Prescriptions  Medication Sig Dispense Refill  . budesonide (PULMICORT) 0.5 MG/2ML nebulizer solution Inhale 2 mLs into the lungs as needed.    Marland Kitchen EPINEPHrine (EPI-PEN) 0.3 mg/0.3 mL SOAJ Inject 0.3 mg into the muscle once as needed (allergic reaction).    Marland Kitchen levothyroxine (SYNTHROID, LEVOTHROID) 75 MCG tablet Take 1 tablet by mouth daily.    . meloxicam (MOBIC) 15 MG tablet Take 1 tablet by mouth as needed.     No current facility-administered medications for this visit.     Allergies  Allergen Reactions  . Amoxicillin-Pot Clavulanate Anaphylaxis  . Ceftriaxone Sodium Anaphylaxis  . Quinolones Anaphylaxis  . Vancomycin Hives  . Erythromycin Diarrhea and Nausea And Vomiting  . Azithromycin Hives and Rash  . Morphine Itching  . Sulfa Antibiotics Hives  . Tape Rash    Adhesive tape - blisters     Past Medical History:  Diagnosis Date  . Arthritis    knees  . Asthma   . B12 deficiency   . Cardiac murmur   . Chest pain    Dr. Lia Foyer wore monitor - all negative  . Drug allergy    multiple- FActine - "Nearly killed me" in 2007 at admission. was in ICU. -Rocephin-2007- tongue swelling- Zpak- Rash in 2010  . Heart murmur    no problems  . Hypothyroidism   . MVP (mitral valve prolapse)   . Pneumonia 2007   hospital X 2 weeks  . PONV (postoperative nausea and vomiting)   . Sinusitis    Acute-on-chronic, remote history of exercise-induced asthma  . SVD  (spontaneous vaginal delivery)    x 1    Past Surgical History:  Procedure Laterality Date  . APPENDECTOMY  10/22/11  . 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 CYSTECTOMY     vaginal cyst  . LAPAROSCOPIC APPENDECTOMY  10/22/2011   Procedure: APPENDECTOMY LAPAROSCOPIC;  Surgeon: Adin Hector, MD;  Location: [facility];  Service: General;  Laterality: N/A;  . LAPAROSCOPIC TUBAL LIGATION Bilateral 07/14/2014   Procedure: LAPAROSCOPIC TUBAL LIGATION;  Surgeon: Cyril Mourning, MD;  Location: [facility];  Service: Gynecology;  Laterality: Bilateral;  . NASAL SINUS SURGERY  2008   X 4, Revision right total ethmoidectomy with right frontal recess expliration, left frontal recess exploration, bilateral sphenoidotomies  . Haivana Nakya   tumor removed   . WISDOM TOOTH EXTRACTION      Social History   Social History  . Marital status: Legally Separated    Spouse name: N/A  . Number of children: 1  . Years of education: N/A   Occupational History  . Account manager    Social History Main Topics  . Smoking status: Never Smoker  . Smokeless tobacco: Never Used  . Alcohol use Yes     Comment: Rarely  . Drug use: No  . Sexual activity: Yes    Birth control/ protection: Other-see comments     Comment: Paraguard removed in MD's office on  05/2014   Other Topics Concern  . Not on file   Social History Narrative   Drinks a lot of coffee    Family History  Problem Relation Age of Onset  . Diabetes Father   . Hypertension Mother   . COPD Mother     smokes  . Thyroid cancer Sister   . Cancer Sister     thyroid  . Hypertension Maternal Uncle   . Hypertension Maternal Grandmother   . Stroke Maternal Grandmother   . Cancer Paternal Grandfather     bladder    ROS: no fevers or chills, productive cough, hemoptysis, dysphasia, odynophagia, melena, hematochezia, dysuria, hematuria, rash, seizure activity, orthopnea, PND, pedal edema, claudication.  Remaining systems are negative.  Physical Exam:   Blood pressure 114/72, pulse 69, height 5\' 2"  (1.575 m), weight 134 lb (60.8 kg).  General:  Well developed/well nourished in NAD Skin warm/dry Patient not depressed No peripheral clubbing Back-normal HEENT-normal/normal eyelids Neck supple/normal carotid upstroke bilaterally; right carotid bruit; no JVD; no thyromegaly chest - CTA/ normal expansion CV - RRR/normal S1 and S2; no murmurs, rubs or gallops;  PMI nondisplaced Abdomen -NT/ND, no HSM, no mass, + bowel sounds, no bruit 2+ femoral pulses, no bruits Ext-no edema, chords, 2+ DP Neuro-grossly nonfocal  ECG sinus rhythm at a rate of 69. No ST changes.  A/P  1 mitral valve prolapse-plan repeat echocardiogram.  2 palpitations-I have provided the name of alivecor to see if rhythm disturbance can be captured.  3 Bruit-right carotid bruit-schedule carotid Dopplers.  Kirk Ruths, MD

## 2016-07-18 ENCOUNTER — Ambulatory Visit (INDEPENDENT_AMBULATORY_CARE_PROVIDER_SITE_OTHER): Payer: 59 | Admitting: Cardiology

## 2016-07-18 ENCOUNTER — Encounter: Payer: Self-pay | Admitting: Cardiology

## 2016-07-18 VITALS — BP 114/72 | HR 69 | Ht 62.0 in | Wt 134.0 lb

## 2016-07-18 DIAGNOSIS — R0989 Other specified symptoms and signs involving the circulatory and respiratory systems: Secondary | ICD-10-CM

## 2016-07-18 DIAGNOSIS — I059 Rheumatic mitral valve disease, unspecified: Secondary | ICD-10-CM

## 2016-07-18 NOTE — Patient Instructions (Signed)
Medication Instructions:   NO CHANGE  Testing/Procedures:  Your physician has requested that you have an echocardiogram. Echocardiography is a painless test that uses sound waves to create images of your heart. It provides your doctor with information about the size and shape of your heart and how well your heart's chambers and valves are working. This procedure takes approximately one hour. There are no restrictions for this procedure.   Your physician has requested that you have a carotid duplex. This test is an ultrasound of the carotid arteries in your neck. It looks at blood flow through these arteries that supply the brain with blood. Allow one hour for this exam. There are no restrictions or special instructions.    Follow-Up:  Your physician wants you to follow-up in: [place] will receive a reminder letter in the mail two months in advance. If you don't receive a letter, please call our office to schedule the follow-up appointment.

## 2016-07-29 ENCOUNTER — Other Ambulatory Visit: Payer: Self-pay | Admitting: Obstetrics and Gynecology

## 2016-07-29 DIAGNOSIS — Z1231 Encounter for screening mammogram for malignant neoplasm of breast: Secondary | ICD-10-CM

## 2016-08-04 ENCOUNTER — Other Ambulatory Visit: Payer: Self-pay

## 2016-08-04 ENCOUNTER — Ambulatory Visit (HOSPITAL_COMMUNITY)
Admission: RE | Admit: 2016-08-04 | Discharge: 2016-08-04 | Disposition: A | Discharge status: Home / Self Care | Payer: 59 | Source: Ambulatory Visit | Attending: Internal Medicine | Admitting: Internal Medicine

## 2016-08-04 ENCOUNTER — Ambulatory Visit (HOSPITAL_COMMUNITY): Payer: 59 | Attending: Cardiology

## 2016-08-04 DIAGNOSIS — I059 Rheumatic mitral valve disease, unspecified: Secondary | ICD-10-CM | POA: Diagnosis present

## 2016-08-04 DIAGNOSIS — R0989 Other specified symptoms and signs involving the circulatory and respiratory systems: Secondary | ICD-10-CM | POA: Diagnosis not present

## 2016-08-04 DIAGNOSIS — I6523 Occlusion and stenosis of bilateral carotid arteries: Secondary | ICD-10-CM | POA: Diagnosis not present

## 2016-09-08 ENCOUNTER — Encounter (INDEPENDENT_AMBULATORY_CARE_PROVIDER_SITE_OTHER): Payer: Self-pay

## 2016-09-08 ENCOUNTER — Ambulatory Visit
Admission: RE | Admit: 2016-09-08 | Discharge: 2016-09-08 | Disposition: A | Discharge status: Home / Self Care | Payer: 59 | Source: Ambulatory Visit | Attending: Obstetrics and Gynecology | Admitting: Obstetrics and Gynecology

## 2016-09-08 DIAGNOSIS — Z1231 Encounter for screening mammogram for malignant neoplasm of breast: Secondary | ICD-10-CM

## 2017-05-11 IMAGING — MG 2D DIGITAL SCREENING BILATERAL MAMMOGRAM WITH CAD AND ADJUNCT TO
8 of 12 series · 8 of 28 positions shown · non-contrast
Comparison: Previous exam(s).

CLINICAL DATA: Screening.

EXAM:
2D DIGITAL SCREENING BILATERAL MAMMOGRAM WITH CAD AND ADJUNCT TOMO

[R CC synth-2D]
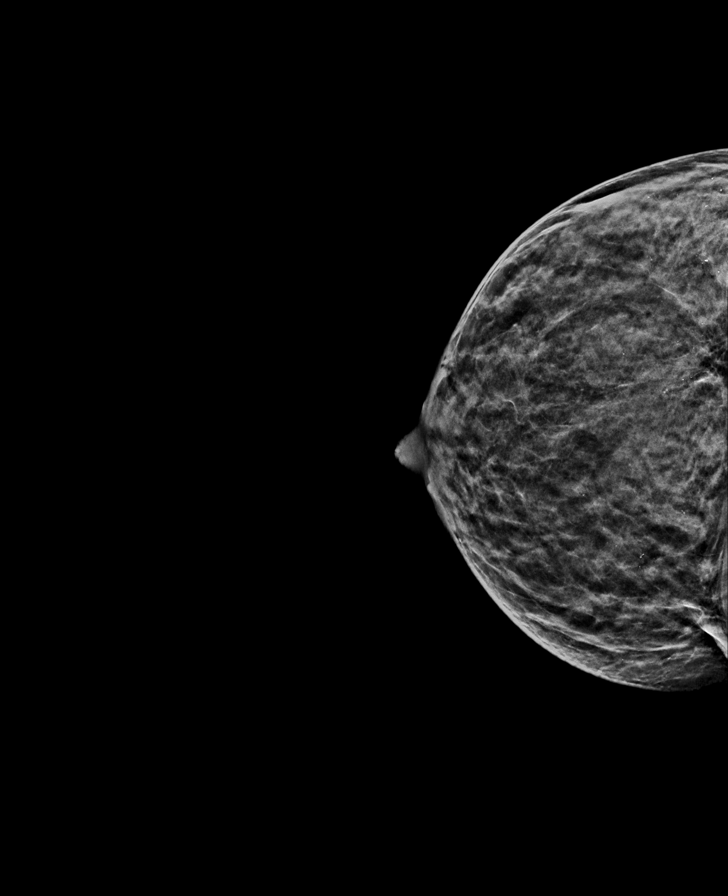

[R CC]
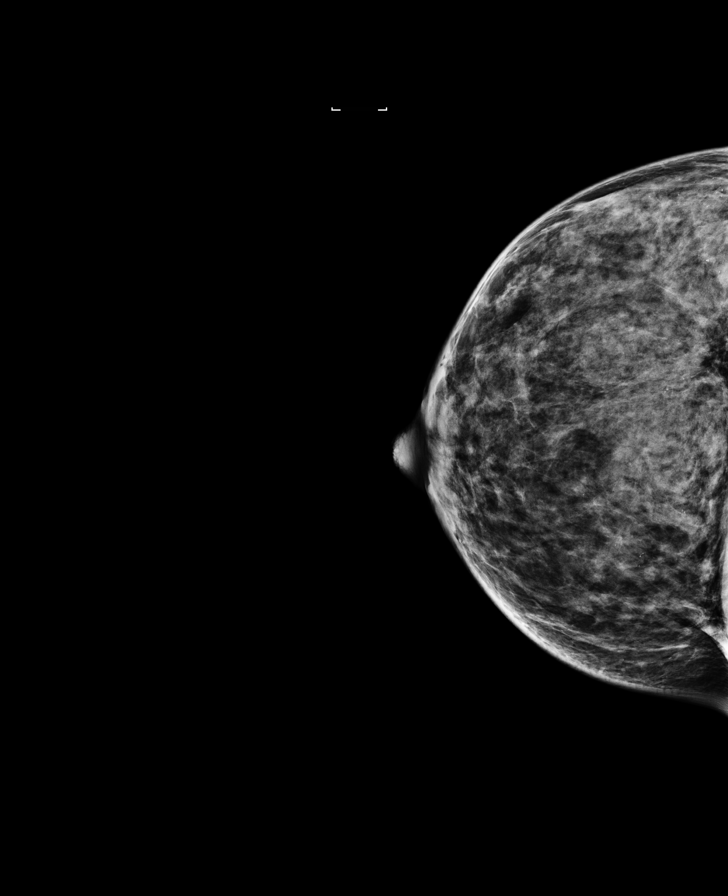

[L CC synth-2D]
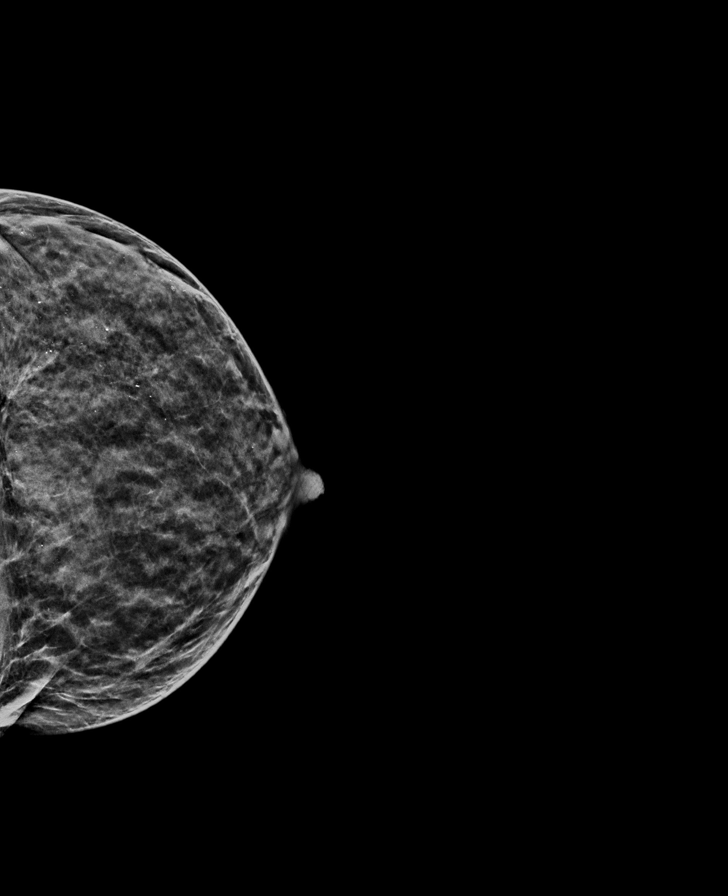

[L CC]
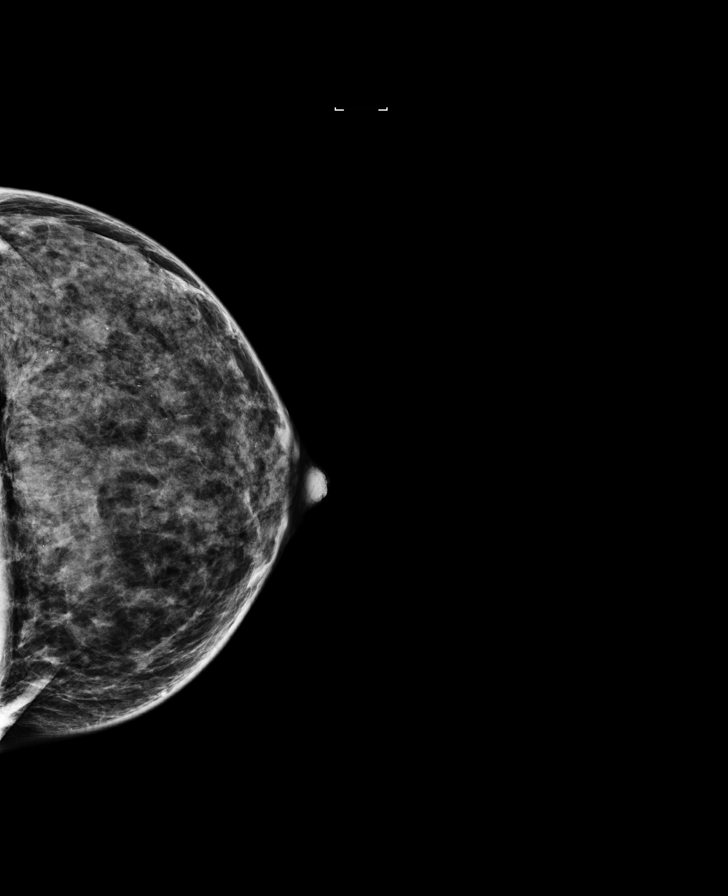

[R MLO]
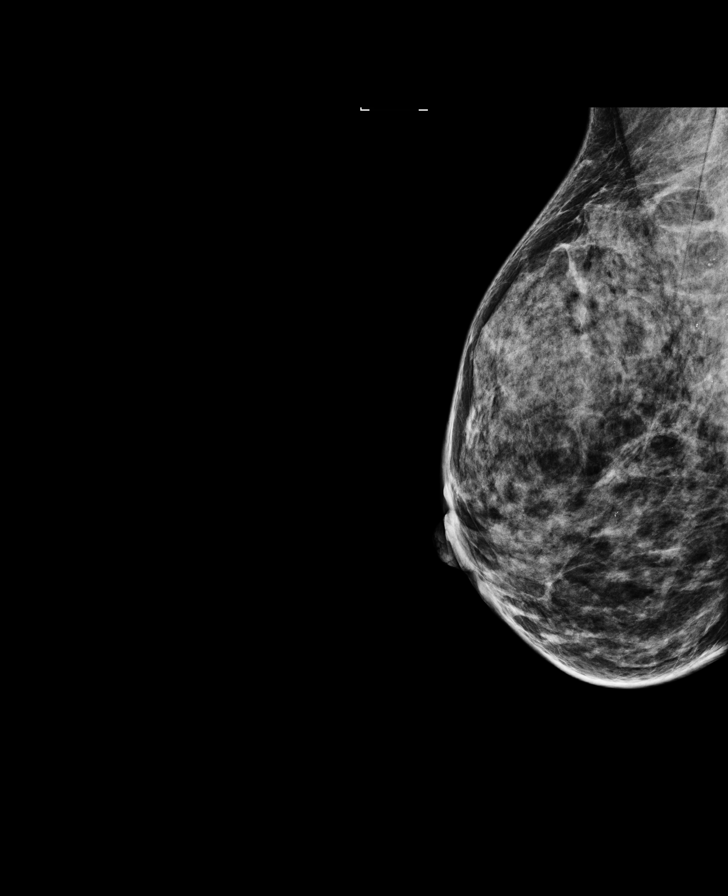

[L MLO]
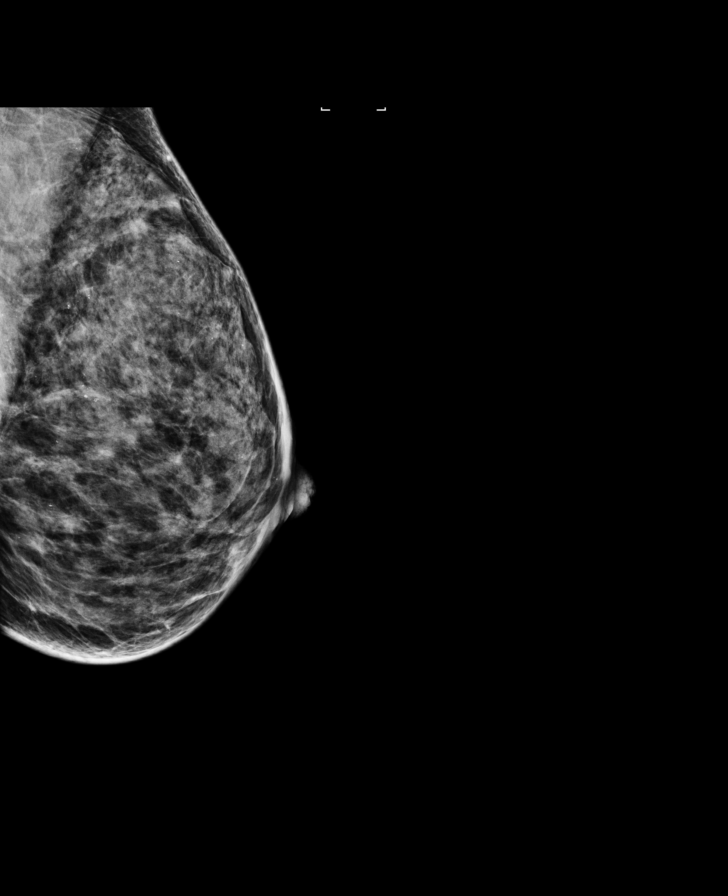

[R MLO synth-2D]
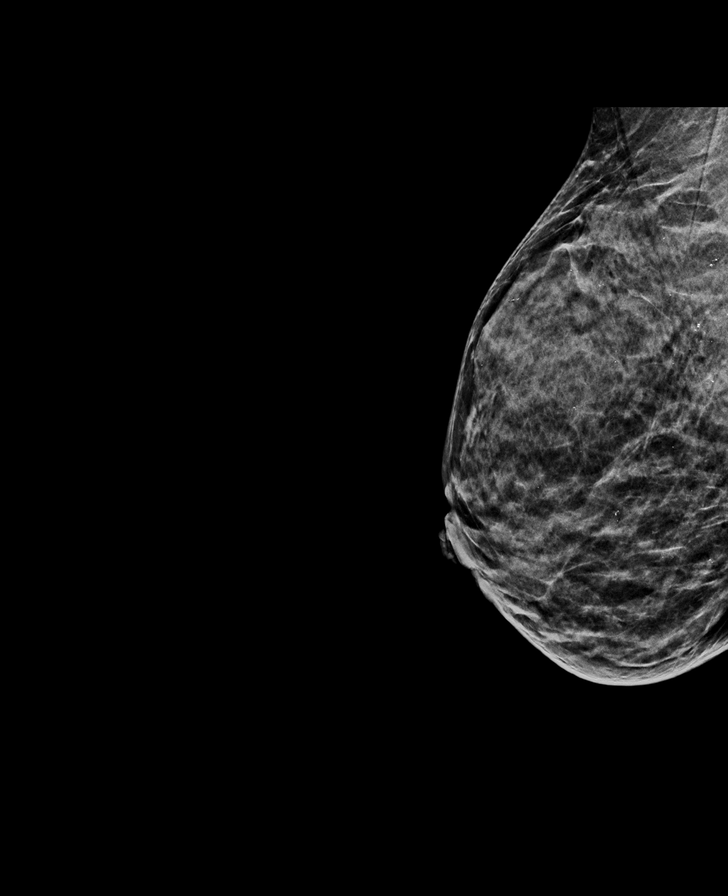

[L MLO synth-2D]
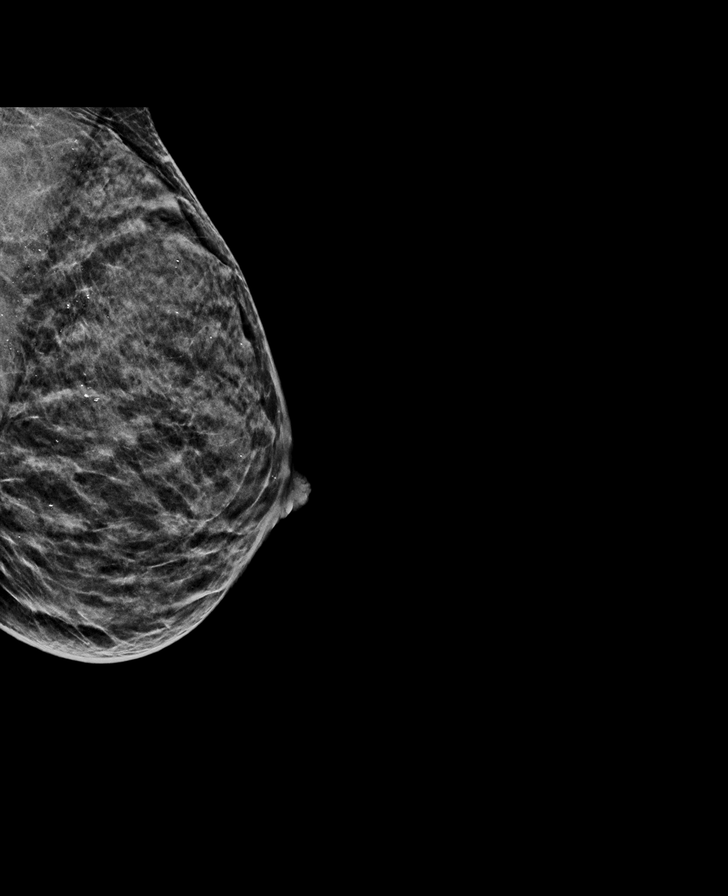

[8 of 28 positions shown; findings below may reference images not displayed]

ACR Breast Density Category d: The breast tissue is extremely dense,
which lowers the sensitivity of mammography.
FINDINGS: There are no findings suspicious for malignancy. Images were
processed with CAD.
IMPRESSION: No mammographic evidence of malignancy. A result letter of this
screening mammogram will be mailed directly to the patient.

RECOMMENDATION:
Screening mammogram in one year. (Code:US-D-RZ7)

BI-RADS CATEGORY  1: Negative.

## 2018-02-12 ENCOUNTER — Other Ambulatory Visit: Payer: Self-pay | Admitting: Obstetrics and Gynecology

## 2018-02-12 DIAGNOSIS — Z1231 Encounter for screening mammogram for malignant neoplasm of breast: Secondary | ICD-10-CM

## 2018-03-12 ENCOUNTER — Ambulatory Visit
Admission: RE | Admit: 2018-03-12 | Discharge: 2018-03-12 | Disposition: A | Discharge status: Home / Self Care | Payer: 59 | Source: Ambulatory Visit | Attending: Obstetrics and Gynecology | Admitting: Obstetrics and Gynecology

## 2018-03-12 DIAGNOSIS — Z1231 Encounter for screening mammogram for malignant neoplasm of breast: Secondary | ICD-10-CM

## 2018-07-06 ENCOUNTER — Encounter: Payer: Self-pay | Admitting: Allergy and Immunology

## 2018-07-06 ENCOUNTER — Ambulatory Visit: Payer: 59 | Admitting: Allergy and Immunology

## 2018-07-06 VITALS — BP 120/74 | HR 75 | Temp 98.8°F | Resp 16 | Ht 62.75 in | Wt 176.0 lb

## 2018-07-06 DIAGNOSIS — J3089 Other allergic rhinitis: Secondary | ICD-10-CM

## 2018-07-06 DIAGNOSIS — J4521 Mild intermittent asthma with (acute) exacerbation: Secondary | ICD-10-CM

## 2018-07-06 DIAGNOSIS — J339 Nasal polyp, unspecified: Secondary | ICD-10-CM

## 2018-07-06 NOTE — Progress Notes (Signed)
Dear Dr. Melford [name],  Thank you for referring [name] [name] to the [facility] on 07/06/2018.   Below is a summation of this patient's evaluation and recommendations.  Thank you for your referral. I will keep you informed about this patient's response to treatment.   If you have any questions please do not hesitate to contact me.   Sincerely,  [name] Prows, MD Allergy / Immunology [place]   ______________________________________________________________________    NEW PATIENT NOTE  Referring Provider: Chesley Noon, MD Primary Provider: Chesley Noon, MD Date of office visit: 07/06/2018    Subjective:   Chief Complaint:  [name] [name] (DOB: 08-27-1970) is a 48 y.o. female who presents to the clinic on 07/06/2018 with a chief complaint of New Patient (Initial Visit) (1 wk 2 days ago heaviness in chest, tightness ); Cough (productive on antobiotic); and Breathing Problem .     HPI: [name] [name] presents to this clinic in evaluation of breathing problems.  I had seen [name] [name] in this clinic approximately 6 years ago for issues tied up with asthma and allergic rhinitis and nasal polyposis and ANCA positivity in association with polymyositis.  During her last evaluation in this clinic she was under the care of [name] [name] and was been treated for polymyositis with systemic steroids. Over the course of the past several years she has basically resolved most of her respiratory issues.  And the vast majority of her polymyositis appear to have resolved with removal of her IUD.  Concerning her asthma, this has been extremely mild and in fact almost nonexistent over the course of the past several years and she rarely uses a short acting bronchodilator and has not required a systemic steroid to treat an exacerbation and many years.  She does not have aspirin hypersensitivity.  Concerning her  allergic rhinitis and nasal polyposis requiring multiple surgical procedures, she is followed by [name] [name], [name] [name], and did require systemic steroid this summer to treat an issue tied up with symptomatic nasal polyposis.  However, prior to that event and since that event she has really done very well with her upper airways and can smell without any problem and has very little nasal congestion while intermittently using desonide nasal drops as needed.  However, about 10 days ago she developed an episode of chest heaviness and chest tightness and feeling irritated in her chest and within several days developed a cough with green phlegm production for which she went to the urgent care center and was treated with doxycycline.  She has had improvement regarding this issue over the course of the past several days with decreased phlegm and decrease ugly sputum production and a decrease heaviness of her chest and decreased chest tightness.  There might have been some minimal nasal congestion associated with this episode but no other associated systemic or constitutional symptoms or fever.  There was not an obvious provoking factor giving rise to this issue.  Past Medical History:  Diagnosis Date  . Arthritis    knees  . Asthma   . B12 deficiency   . Cardiac murmur   . Chest pain    Dr. Lia Foyer wore monitor - all negative  . Drug allergy    multiple- FActine - "Nearly killed me" in 2007 at admission. was in ICU. -Rocephin-2007- tongue swelling- Zpak- Rash in 2010  . Heart murmur    no problems  .  Hypothyroidism   . MVP (mitral valve prolapse)   . Pneumonia 2007   hospital X 2 weeks  . PONV (postoperative nausea and vomiting)   . Sinusitis    Acute-on-chronic, remote history of exercise-induced asthma  . SVD (spontaneous vaginal delivery)    x 1    Past Surgical History:  Procedure Laterality Date  . APPENDECTOMY  10/22/11  . 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  CYSTECTOMY     vaginal cyst  . LAPAROSCOPIC APPENDECTOMY  10/22/2011   Procedure: APPENDECTOMY LAPAROSCOPIC;  Surgeon: [name] Hector, MD;  Location: [facility];  Service: General;  Laterality: N/A;  . LAPAROSCOPIC TUBAL LIGATION Bilateral 07/14/2014   Procedure: LAPAROSCOPIC TUBAL LIGATION;  Surgeon: [name] Mourning, MD;  Location: [facility];  Service: Gynecology;  Laterality: Bilateral;  . NASAL SINUS SURGERY  2008   X 4, Revision right total ethmoidectomy with right frontal recess expliration, left frontal recess exploration, bilateral sphenoidotomies  . [place]   tumor removed   . WISDOM TOOTH EXTRACTION      Allergies as of 07/06/2018      Reactions   Amoxicillin-pot Clavulanate Anaphylaxis   Ceftriaxone Sodium Anaphylaxis   Quinolones Anaphylaxis   Sulfamethoxazole-trimethoprim Anaphylaxis, Other (See Comments)   Vancomycin Hives   Erythromycin Diarrhea, Nausea And Vomiting   Azithromycin Hives, Rash   Morphine Itching   Sulfa Antibiotics Hives   Tape Rash, Other (See Comments)   Adhesive tape - blisters blisters      Medication List      albuterol 108 (90 Base) MCG/ACT inhaler Commonly known as:  PROVENTIL HFA;VENTOLIN HFA Inhale into the lungs.   budesonide 0.5 MG/2ML nebulizer solution Commonly known as:  PULMICORT budesonide 0.5 mg/2 mL suspension for nebulization  prn   Diethylpropion HCl CR 75 MG Tb24 diethylpropion ER 75 mg tablet,extended release  Take 1 tablet every day by oral route.   doxycycline 100 MG capsule Commonly known as:  VIBRAMYCIN One tablet twice a day   EPINEPHrine 0.3 mg/0.3 mL Soaj injection Commonly known as:  EPI-PEN Inject 0.3 mg into the muscle once as needed (allergic reaction).   levothyroxine 75 MCG tablet Commonly known as:  SYNTHROID, LEVOTHROID Take 1 tablet by mouth daily.   meloxicam 15 MG tablet Commonly known as:  MOBIC Take 1 tablet by mouth as needed.   progesterone 100 MG capsule Commonly  known as:  PROMETRIUM progesterone micronized 100 mg capsule  10 days per month       Review of systems negative except as noted in HPI / PMHx or noted below:  Review of Systems  Constitutional: Negative.   HENT: Negative.   Eyes: Negative.   Respiratory: Negative.   Cardiovascular: Negative.   Gastrointestinal: Negative.   Genitourinary: Negative.   Musculoskeletal: Negative.   Skin: Negative.   Neurological: Negative.   Endo/Heme/Allergies: Negative.   Psychiatric/Behavioral: Negative.     Family History  Problem Relation Age of Onset  . Diabetes Father   . Hypertension Mother   . COPD Mother        smokes  . Thyroid cancer Sister   . Cancer Sister        thyroid  . Hypertension Maternal Uncle   . Hypertension Maternal Grandmother   . Stroke Maternal Grandmother   . Cancer Paternal Grandfather        bladder  . Allergic rhinitis Neg Hx   . Asthma  Neg Hx   . Eczema Neg Hx   . Urticaria Neg Hx     Social History   Socioeconomic History  . Marital status: Legally Separated    Spouse name: Not on file  . Number of children: 1  . Years of education: Not on file  . Highest education level: Not on file  Occupational History  . Occupation: Passenger transport manager  Social Needs  . Financial resource strain: Not on file  . Food insecurity:    Worry: Not on file    Inability: Not on file  . Transportation needs:    Medical: Not on file    Non-medical: Not on file  Tobacco Use  . Smoking status: Never Smoker  . Smokeless tobacco: Never Used  Substance and Sexual Activity  . Alcohol use: Yes    Comment: Rarely  . Drug use: No  . Sexual activity: Yes    Birth control/protection: Other-see comments    Comment: Paraguard removed in MD's office on 05/2014  Lifestyle  . Physical activity:    Days per week: Not on file    Minutes per session: Not on file  . Stress: Not on file  Relationships  . Social connections:    Talks on phone: Not on file    Gets together:  Not on file    Attends religious service: Not on file    Active member of club or organization: Not on file    Attends meetings of clubs or organizations: Not on file    Relationship status: Not on file  . Intimate partner violence:    Fear of current or ex partner: Not on file    Emotionally abused: Not on file    Physically abused: Not on file    Forced sexual activity: Not on file  Other Topics Concern  . Not on file  Social History Narrative   Drinks a lot of coffee    Environmental and Social history  Lives in a house with a dry environment, a dog located inside the household, carpet in the bedroom, plastic on the bed, plastic on the pillow, and no smoking ongoing with inside the household.  She works in an TEFL teacher as Museum/gallery conservator.  Objective:   Vitals:   07/06/18 0934  BP: 120/74  Pulse: 75  Resp: 16  Temp: 98.8 F (37.1 C)  SpO2: 95%   Height: 5' 2.75" (159.4 cm) Weight: 176 lb (79.8 kg)  Physical Exam Constitutional:      Appearance: She is not diaphoretic.  HENT:     Head: Normocephalic. No right periorbital erythema or left periorbital erythema.     Right Ear: Tympanic membrane, ear canal and external ear normal.     Left Ear: Tympanic membrane, ear canal and external ear normal.     Nose: Nose normal. No mucosal edema or rhinorrhea.     Mouth/Throat:     Pharynx: Uvula midline. No oropharyngeal exudate.  Eyes:     General: Lids are normal.     Conjunctiva/sclera: Conjunctivae normal.     Pupils: Pupils are equal, round, and reactive to light.  Neck:     Thyroid: No thyromegaly.     Trachea: Trachea normal. No tracheal tenderness or tracheal deviation.  Cardiovascular:     Rate and Rhythm: Normal rate and regular rhythm.     Heart sounds: Normal heart sounds, S1 normal and S2 normal. No murmur.  Pulmonary:     Effort:  Pulmonary effort is normal. No respiratory distress.     Breath sounds: Normal breath  sounds. No stridor. No wheezing or rales.  Chest:     Chest wall: No tenderness.  Abdominal:     General: There is no distension.     Palpations: Abdomen is soft. There is no mass.     Tenderness: There is no abdominal tenderness. There is no guarding or rebound.  Musculoskeletal:        General: No tenderness.  Lymphadenopathy:     Head:     Right side of head: No tonsillar adenopathy.     Left side of head: No tonsillar adenopathy.     Cervical: No cervical adenopathy.  Skin:    Coloration: Skin is not pale.     Findings: No erythema or rash.     Nails: There is no clubbing.   Neurological:     Mental Status: She is alert.     Diagnostics: Allergy skin tests were not performed.  Review of previous skin testing performed 14 August 2010 identified hypersensitivity against cedar pollen and dust mite with slight hypersensitivity against a tree pollen mix, weed mix, and Alternaria and Cladosporium mold.  Spirometry was performed and demonstrated an FEV1 of 2.67 @ 95 % of predicted. FEV1/FVC = 0.84  Results of blood tests obtained for November 2019 identified WBC 5.2, absolute eosinophil 100, absolute lymphocyte 1200, hemoglobin 13.9, platelet 237, creatinine 0.89 mg/DL, AST 20 2U/L, ALT 12 U/L   Assessment and Plan:    1. Asthma, not well controlled, mild intermittent, with acute exacerbation   2. Perennial allergic rhinitis   3. Nasal polyps     1.  Use a sample of Asmanex 200 HFA  2 inhalations twice a day for 1-2 weeks  2.  Complete full course of doxycycline  3.  Continue nasal budesonide during periods of upper airway symptoms  4.  If needed:   A.  Pro-air HFA or similar 2 inhalations every 4-6 hours  B.  Nasal saline  C.  OTC antihistamine  5.  Further evaluation and treatment?  Yes, if recurrent symptoms  6.  When better obtain flu vaccine  7.  Dupilumab for nasal polyps?  8.  Bone density scan?  9.  Return to clinic in 1 year or earlier if  problem  At this point in time I will assume that Jozey contracted a viral respiratory tract infection that gave rise to inflammation of her lower airway recently and treat her with a few weeks of inhaled steroid and she can complete her course of doxycycline.  However, it should be noted that on previous skin testing she did demonstrate hypersensitivity to cedar pollen which has been prevalent during our warm weather this winter only to evaporate during this most recent cold weather snap.  If Rhegan goes through this upcoming spring with significant respiratory tract issues then we will need to approach her issue a little differently than currently described above.  As well, she has had several surgeries for nasal polyposis and still appears to require systemic steroid every few years to treat this issue.  If her nasal polyp disease becomes more active then I think she should consider starting a course of dupilumab to address this issue.  In addition, she apparently had a bone scan several years ago which apparently was normal and I recommended that she keep attention to this health maintenance study as she moves forward given her previous history of using systemic steroids  quite commonly to treat mucosal inflammation in the past.  I will see her back in this clinic in 1 year or earlier if there is a problem.  [name] Prows, MD Allergy / Immunology [facility]

## 2018-07-06 NOTE — Patient Instructions (Addendum)
  1.  Use a sample of Asmanex 200 HFA  2 inhalations twice a day for 1-2 weeks  2.  Complete full course of doxycycline  3.  Continue nasal budesonide during periods of upper airway symptoms  4.  If needed:   A.  Pro-air HFA or similar 2 inhalations every 4-6 hours  B.  Nasal saline  C.  OTC antihistamine  5.  Further evaluation and treatment?  Yes, if recurrent symptoms  6.  When better obtain flu vaccine  7.  Dupilumab for nasal polyps?  8.  Bone density scan?  9.  Return to clinic in 1 year or earlier if problem

## 2018-07-07 ENCOUNTER — Encounter: Payer: Self-pay | Admitting: Allergy and Immunology

## 2019-04-05 ENCOUNTER — Other Ambulatory Visit: Payer: Self-pay | Admitting: Obstetrics and Gynecology

## 2019-04-05 DIAGNOSIS — Z1231 Encounter for screening mammogram for malignant neoplasm of breast: Secondary | ICD-10-CM

## 2019-05-27 ENCOUNTER — Other Ambulatory Visit: Payer: Self-pay

## 2019-05-27 ENCOUNTER — Ambulatory Visit
Admission: RE | Admit: 2019-05-27 | Discharge: 2019-05-27 | Disposition: A | Discharge status: Home / Self Care | Payer: 59 | Source: Ambulatory Visit | Attending: Obstetrics and Gynecology | Admitting: Obstetrics and Gynecology

## 2019-05-27 DIAGNOSIS — Z1231 Encounter for screening mammogram for malignant neoplasm of breast: Secondary | ICD-10-CM

## 2020-06-01 ENCOUNTER — Other Ambulatory Visit: Payer: Self-pay | Admitting: Obstetrics and Gynecology

## 2020-06-01 DIAGNOSIS — Z1231 Encounter for screening mammogram for malignant neoplasm of breast: Secondary | ICD-10-CM

## 2020-06-22 ENCOUNTER — Other Ambulatory Visit: Payer: Self-pay

## 2020-06-22 ENCOUNTER — Ambulatory Visit
Admission: RE | Admit: 2020-06-22 | Discharge: 2020-06-22 | Disposition: A | Discharge status: Home / Self Care | Payer: 59 | Source: Ambulatory Visit | Attending: Obstetrics and Gynecology | Admitting: Obstetrics and Gynecology

## 2020-06-22 DIAGNOSIS — Z1231 Encounter for screening mammogram for malignant neoplasm of breast: Secondary | ICD-10-CM

## 2021-04-22 ENCOUNTER — Encounter: Payer: Self-pay | Admitting: Allergy and Immunology

## 2021-04-22 ENCOUNTER — Other Ambulatory Visit: Payer: Self-pay

## 2021-04-22 ENCOUNTER — Ambulatory Visit: Payer: 59 | Admitting: Allergy and Immunology

## 2021-04-22 VITALS — BP 102/64 | HR 64 | Ht 62.0 in | Wt 145.0 lb

## 2021-04-22 DIAGNOSIS — J3089 Other allergic rhinitis: Secondary | ICD-10-CM | POA: Diagnosis not present

## 2021-04-22 DIAGNOSIS — J339 Nasal polyp, unspecified: Secondary | ICD-10-CM | POA: Diagnosis not present

## 2021-04-22 DIAGNOSIS — J4520 Mild intermittent asthma, uncomplicated: Secondary | ICD-10-CM | POA: Diagnosis not present

## 2021-04-22 MED ORDER — DUPILUMAB 300 MG/2ML ~~LOC~~ SOSY
300.0000 mg | PREFILLED_SYRINGE | SUBCUTANEOUS | Status: AC
Start: 2021-04-22 — End: ?
  Administered 2021-04-22: 300 mg via SUBCUTANEOUS

## 2021-04-22 NOTE — Progress Notes (Signed)
[facility]   Follow-up Note  Referring Provider: Chesley Noon, MD Primary Provider: Chesley Noon, MD Date of Office Visit: 04/22/2021  Subjective:   [name] [name] (DOB: June 18, 1970) is a 50 y.o. female who returns to the [facility] on 04/22/2021 in re-evaluation of the following:  HPI: [name] [name] returns to this clinic in evaluation of asthma, allergic rhinitis, chronic sinusitis, nasal polyposis.  I have not seen her in this clinic since 06 July 2018.  She has continued to have problems with her nasal polyposis.  She has required 2 courses of systemic steroids just in the past several months to treat her rather significant nasal congestion.  She has had 8 upper airway procedures to remove polyps.  She uses budesonide washes on a consistent basis.  Fortunately, she can still smell.  She might be having a little bit more problem with nasal congestion and some sneezing ever since she has had exposure to dog about twice a week at a friend's house since April 2022.  Her asthma has been nonexistent and she has not required a short acting bronchodilator in years.  She contracted COVID in December 2021 manifested as a pharyngitis and headache for 3 days with no other symptoms.  She has not received a COVID-vaccine.  She has not received a flu vaccine.  Allergies as of 04/22/2021       Reactions   Amoxicillin-pot Clavulanate Anaphylaxis   Augmentin- rash   Ceftriaxone Sodium Anaphylaxis   Rocephin   Gemifloxacin Anaphylaxis   Iodine Anaphylaxis   Quinolones Anaphylaxis   Sulfamethoxazole-trimethoprim Anaphylaxis, Other (See Comments)   Vancomycin Hives   Erythromycin Diarrhea, Nausea And Vomiting   Azithromycin Hives, Rash   Morphine Itching   Sulfa Antibiotics Hives   Tape Rash, Other (See Comments)   Adhesive tape - blisters blisters   Tapentadol Rash, Other (See Comments)   Adhesive tape -  blisters blisters Adhesive tape - blisters blisters        Medication List    albuterol 108 (90 Base) MCG/ACT inhaler Commonly known as: VENTOLIN HFA Inhale into the lungs.   budesonide 0.5 MG/2ML nebulizer solution Commonly known as: PULMICORT budesonide 0.5 mg/2 mL suspension for nebulization  prn   EPINEPHrine 0.3 mg/0.3 mL Soaj injection Commonly known as: EPI-PEN Inject 0.3 mg into the muscle once as needed (allergic reaction).   ketoconazole 2 % shampoo Commonly known as: NIZORAL SMARTSIG:5 Milliliter(s) Topical Daily   progesterone 100 MG capsule Commonly known as: PROMETRIUM progesterone micronized 100 mg capsule  10 days per month   tretinoin 0.025 % cream Commonly known as: RETIN-A SMARTSIG:Sparingly Topical Every Evening    Past Medical History:  Diagnosis Date   Arthritis    knees   Asthma    B12 deficiency    Cardiac murmur    Chest pain    Dr. Lia Foyer wore monitor - all negative   Drug allergy    multiple- FActine - "Nearly killed me" in 2007 at admission. was in ICU. 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    CYSTECTOMY     vaginal cyst   LAPAROSCOPIC APPENDECTOMY  10/22/2011   Procedure: APPENDECTOMY LAPAROSCOPIC;  Surgeon: Adin Hector, MD;  Location: [facility];  Service: General;  Laterality: N/A;   LAPAROSCOPIC TUBAL LIGATION Bilateral 07/14/2014   Procedure: LAPAROSCOPIC TUBAL LIGATION;  Surgeon: Cyril Mourning, MD;  Location: [facility];  Service: Gynecology;  Laterality: Bilateral;   NASAL SINUS SURGERY   2008   X 4, Revision right total ethmoidectomy with right frontal recess expliration, left frontal recess exploration, bilateral sphenoidotomies   TIBIA FRACTURE SURGERY  1994   tumor removed    WISDOM TOOTH EXTRACTION      Review of systems negative except as noted in HPI / PMHx or noted below:  Review of Systems  Constitutional: Negative.   HENT: Negative.    Eyes: Negative.   Respiratory: Negative.    Cardiovascular: Negative.   Gastrointestinal: Negative.   Genitourinary: Negative.   Musculoskeletal: Negative.   Skin: Negative.   Neurological: Negative.   Endo/Heme/Allergies: Negative.   Psychiatric/Behavioral: Negative.      Objective:   Vitals:   04/22/21 1459  BP: 102/64  Pulse: 64  SpO2: 99%   Height: 5\' 2"  (157.5 cm)  Weight: 145 lb (65.8 kg)   Physical Exam Constitutional:      Appearance: She is not diaphoretic.  HENT:     Head: Normocephalic.     Right Ear: Tympanic membrane, ear canal and external ear normal.     Left Ear: Tympanic membrane, ear canal and external ear normal.     Nose: Nose normal. No mucosal edema or rhinorrhea.     Mouth/Throat:     Pharynx: Uvula midline. No oropharyngeal exudate.  Eyes:     Conjunctiva/sclera: Conjunctivae normal.  Neck:     Thyroid: No thyromegaly.     Trachea: Trachea normal. No tracheal tenderness or tracheal deviation.  Cardiovascular:     Rate and Rhythm: Normal rate and regular rhythm.     Heart sounds: Normal heart sounds, S1 normal and S2 normal. No murmur heard. Pulmonary:     Effort: No respiratory distress.     Breath sounds: Normal breath sounds. No stridor. No wheezing or rales.  Lymphadenopathy:     Head:     Right side of head: No tonsillar adenopathy.     Left side of head: No tonsillar adenopathy.     Cervical: No cervical adenopathy.  Skin:    Findings: No erythema or rash.     Nails: There is no clubbing.  Neurological:     Mental Status: She is alert.    Diagnostics:  Results  of a nasal endoscopy performed by Dr. Lonell Grandchild, [facility], on 08 March 2021 identified the following:  Right -- Narrower than left. Polyps filling middle meatus.  Left- Middle meatus open but polyps in frontal recess. Polyp lateral to head of middle turbinate. Maxillary and ethmoid open  Assessment and Plan:   1. Nasal polyposis   2. Asthma, mild intermittent, well-controlled   3. Perennial allergic rhinitis     1.  Start dupilumab injections today and every 2 weeks.  2.  Continue budesonide washes daily.  3.  If needed:  A. Albuterol HFA - 2 inhalations every 4-6 hours  4.  [place] booster  5.  Obtain flu vaccine  6.  Return to clinic in 12 weeks or earlier if problem  7.  Further treatment for allergic disease???  I have started Solstice on dupilumab injections given her resilient nasal polyposis requiring 8 surgical procedures and multiple courses of systemic steroids to control.  I see her back in this clinic in 12 weeks to make a determination about her response to this agent and consider further evaluation and treatment based upon this response.  Allena Katz, MD Allergy / Immunology [place]

## 2021-04-22 NOTE — Patient Instructions (Addendum)
  1.  Start dupilumab injections today and every 2 weeks.  2.  Continue budesonide washes daily.  3.  If needed:  A. Albuterol HFA - 2 inhalations every 4-6 hours  4.  [place] booster  5.  Obtain flu vaccine  6.  Return to clinic in 12 weeks or earlier if problem  7.  Further treatment for allergic disease???

## 2021-04-23 ENCOUNTER — Telehealth: Payer: Self-pay | Admitting: *Deleted

## 2021-04-23 ENCOUNTER — Encounter: Payer: Self-pay | Admitting: Allergy and Immunology

## 2021-04-23 NOTE — Telephone Encounter (Signed)
Called patient and advised approval for [employer]. She has copay card to activate. Delivery, storage and dosing instructions given to patient

## 2021-04-23 NOTE — Telephone Encounter (Signed)
-----   Message from Jiles Prows, MD sent at 04/23/2021  7:16 AM EST ----- Dupi for nasal polyps

## 2021-05-20 ENCOUNTER — Other Ambulatory Visit: Payer: Self-pay | Admitting: Obstetrics and Gynecology

## 2021-05-20 DIAGNOSIS — Z1231 Encounter for screening mammogram for malignant neoplasm of breast: Secondary | ICD-10-CM

## 2021-05-28 ENCOUNTER — Telehealth: Payer: Self-pay | Admitting: *Deleted

## 2021-05-28 NOTE — Telephone Encounter (Signed)
Patient l/m for me to contact her regarding possible side effects from [place]. I called and had to l/m for patient to call me

## 2021-05-28 NOTE — Telephone Encounter (Signed)
Patient called and advised some joint pain especially wrists started one week after starting Xolair. She wants to know if she should stop and see if the the joint pain ceases or continue therapy and see if goes away. Please advise

## 2021-05-29 NOTE — Telephone Encounter (Signed)
Informed patient of Dr. Bruna Potter message. She will let us know if the joint pain continues or gets worse with the next few injections.

## 2021-06-24 ENCOUNTER — Ambulatory Visit: Payer: 59

## 2021-07-15 ENCOUNTER — Encounter: Payer: Self-pay | Admitting: Allergy and Immunology

## 2021-07-15 ENCOUNTER — Ambulatory Visit: Payer: 59 | Admitting: Allergy and Immunology

## 2021-07-15 ENCOUNTER — Other Ambulatory Visit: Payer: Self-pay

## 2021-07-15 VITALS — BP 122/78 | HR 68 | Resp 14

## 2021-07-15 DIAGNOSIS — J324 Chronic pansinusitis: Secondary | ICD-10-CM

## 2021-07-15 DIAGNOSIS — J4520 Mild intermittent asthma, uncomplicated: Secondary | ICD-10-CM | POA: Diagnosis not present

## 2021-07-15 DIAGNOSIS — J339 Nasal polyp, unspecified: Secondary | ICD-10-CM | POA: Diagnosis not present

## 2021-07-15 DIAGNOSIS — J3089 Other allergic rhinitis: Secondary | ICD-10-CM

## 2021-07-15 NOTE — Progress Notes (Signed)
[facility]   Follow-up Note  Referring Provider: Chesley Noon, MD Primary Provider: Chesley Noon, MD Date of Office Visit: 07/15/2021  Subjective:   [name] [name] (DOB: 1971-06-11) is a 51 y.o. female who returns to the [facility] on 07/15/2021 in re-evaluation of the following:  HPI: Zamariah returns to this clinic in evaluation of asthma, allergic rhinitis, chronic sinusitis, nasal polyposis.  Her last visit to this clinic was 22 April 2021.  She does not really think that dupilumab administration, which was started during her last visit, has really made much difference.  She still has lots of nasal congestion and mucus that is green.  When she uses budesonide washes it does help this issue.  She is scheduled for a rhinoscopy tomorrow with Dr. Vinetta Bergamo, [facility].  She developed some joint pain when initially starting on dupilumab affecting her left wrist and various other joints.  Fortunately, that has improved significantly and she is just left with a little bit of discomfort in her left knee.  She has had no issues with asthma.  She rarely uses a short acting bronchodilator.  Allergies as of 07/15/2021       Reactions   Amoxicillin-pot Clavulanate Anaphylaxis   Augmentin- rash   Ceftriaxone Sodium Anaphylaxis   Rocephin   Gemifloxacin Anaphylaxis   Iodine Anaphylaxis   Quinolones Anaphylaxis   Sulfamethoxazole-trimethoprim Anaphylaxis, Other (See Comments)   Vancomycin Hives   Erythromycin Diarrhea, Nausea And Vomiting   Azithromycin Hives, Rash   Morphine Itching   Sulfa Antibiotics Hives   Tape Rash, Other (See Comments)   Adhesive tape - blisters blisters   Tapentadol Rash, Other (See Comments)   Adhesive tape - blisters blisters Adhesive tape - blisters blisters        Medication List    albuterol 108 (90 Base) MCG/ACT inhaler Commonly known as: VENTOLIN HFA Inhale into the  lungs.   budesonide 0.5 MG/2ML nebulizer solution Commonly known as: PULMICORT budesonide 0.5 mg/2 mL suspension for nebulization  prn   ketoconazole 2 % shampoo Commonly known as: NIZORAL SMARTSIG:5 Milliliter(s) Topical Daily   progesterone 100 MG capsule Commonly known as: PROMETRIUM   tretinoin 0.025 % cream Commonly known as: RETIN-A SMARTSIG:Sparingly Topical Every Evening     Past Medical History:  Diagnosis Date   Arthritis    knees   Asthma    B12 deficiency    Cardiac murmur    Chest pain    Dr. Lia Foyer wore monitor - all negative   Drug allergy    multiple- FActine - "Nearly killed me" in 2007 at admission. was in ICU. 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    CYSTECTOMY     vaginal cyst   LAPAROSCOPIC APPENDECTOMY  10/22/2011   Procedure: APPENDECTOMY LAPAROSCOPIC;  Surgeon: Adin Hector, MD;  Location: [facility];  Service: General;  Laterality: N/A;   LAPAROSCOPIC TUBAL  LIGATION Bilateral 07/14/2014   Procedure: LAPAROSCOPIC TUBAL LIGATION;  Surgeon: Cyril Mourning, MD;  Location: [facility];  Service: Gynecology;  Laterality: Bilateral;   NASAL SINUS SURGERY  2008   X 4, Revision right total ethmoidectomy with right frontal recess expliration, left frontal recess exploration, bilateral sphenoidotomies   TIBIA FRACTURE SURGERY  1994   tumor removed    WISDOM TOOTH EXTRACTION      Review of systems negative except as noted in HPI / PMHx or noted below:  Review of Systems  Constitutional: Negative.   HENT: Negative.     Eyes: Negative.   Respiratory: Negative.    Cardiovascular: Negative.   Gastrointestinal: Negative.   Genitourinary: Negative.   Musculoskeletal: Negative.   Skin: Negative.   Neurological: Negative.   Endo/Heme/Allergies: Negative.   Psychiatric/Behavioral: Negative.      Objective:   Vitals:   07/15/21 1037  BP: 122/78  Pulse: 68  Resp: 14  SpO2: 96%          Physical Exam Constitutional:      Appearance: She is not diaphoretic.  HENT:     Head: Normocephalic.     Right Ear: Tympanic membrane, ear canal and external ear normal.     Left Ear: Tympanic membrane, ear canal and external ear normal.     Nose: Nose normal. No mucosal edema or rhinorrhea.     Mouth/Throat:     Pharynx: Uvula midline. No oropharyngeal exudate.  Eyes:     Conjunctiva/sclera: Conjunctivae normal.  Neck:     Thyroid: No thyromegaly.     Trachea: Trachea normal. No tracheal tenderness or tracheal deviation.  Cardiovascular:     Rate and Rhythm: Normal rate and regular rhythm.     Heart sounds: Normal heart sounds, S1 normal and S2 normal. No murmur heard. Pulmonary:     Effort: No respiratory distress.     Breath sounds: Normal breath sounds. No stridor. No wheezing or rales.  Lymphadenopathy:     Head:     Right side of head: No tonsillar adenopathy.     Left side of head: No tonsillar adenopathy.     Cervical: No cervical adenopathy.  Skin:    Findings: No erythema or rash.     Nails: There is no clubbing.  Neurological:     Mental Status: She is alert.    Diagnostics: none  Assessment and Plan:   1. Nasal polyposis   2. Chronic pansinusitis   3. Perennial allergic rhinitis   4. Asthma, mild intermittent, well-controlled     1.  Obtain rhinoscopy tomorrow.  Mepolizumab???  2.  Continue budesonide washes daily.  3.  If needed:  A. Albuterol HFA - 2 inhalations every 4-6 hours  4.  Blood - CBC w/d, ANCA w/ R, Area 2 Aeroallergen profile  5.  Return to clinic in 6  months or earlier if problem  We will have Winni obtain her rhinoscopy tomorrow and I suspect it is going to show some nasal polyposis and continued problems with chronic sinusitis and then she would be a candidate for mepolizumab as she has failed dupilumab.  She did have a positive ANCA in the past and we will follow-up that issue by rechecking an ANCA and also take a look at whether or not she has eosinophilia and atopic disease driving some of her issue.  We will see her back in this clinic in 6 months or earlier if there is a problem.  Allena Katz, MD  Allergy / Immunology [facility]

## 2021-07-15 NOTE — Patient Instructions (Addendum)
°  1.  Obtain rhinoscopy tomorrow.  Mepolizumab???  2.  Continue budesonide washes daily.  3.  If needed:  A. Albuterol HFA - 2 inhalations every 4-6 hours  4.  Blood - CBC w/d, ANCA w/ R, Area 2 Aeroallergen profile  5.  Return to clinic in 6 months or earlier if problem

## 2021-07-16 ENCOUNTER — Encounter: Payer: Self-pay | Admitting: Allergy and Immunology

## 2021-07-19 LAB — CBC WITH DIFFERENTIAL/PLATELET
Basophils Absolute: 0.1 10*3/uL (ref 0.0–0.2)
Basos: 1 %
EOS (ABSOLUTE): 0.1 10*3/uL (ref 0.0–0.4)
Eos: 1 %
Hematocrit: 42.5 % (ref 34.0–46.6)
Hemoglobin: 13.9 g/dL (ref 11.1–15.9)
Immature Grans (Abs): 0 10*3/uL (ref 0.0–0.1)
Immature Granulocytes: 0 %
Lymphocytes Absolute: 1.5 10*3/uL (ref 0.7–3.1)
Lymphs: 19 %
MCH: 29.1 pg (ref 26.6–33.0)
MCHC: 32.7 g/dL (ref 31.5–35.7)
MCV: 89 fL (ref 79–97)
Monocytes Absolute: 0.5 10*3/uL (ref 0.1–0.9)
Monocytes: 7 %
Neutrophils Absolute: 5.5 10*3/uL (ref 1.4–7.0)
Neutrophils: 72 %
Platelets: 245 10*3/uL (ref 150–450)
RBC: 4.77 x10E6/uL (ref 3.77–5.28)
RDW: 12.1 % (ref 11.7–15.4)
WBC: 7.6 10*3/uL (ref 3.4–10.8)

## 2021-07-19 LAB — ANCA PROFILE
Anti-MPO Antibodies: <0.2 units (ref 0.0–0.9)
Anti-PR3 Antibodies: <0.2 units (ref 0.0–0.9)
Atypical pANCA: 1:640 {titer} — ABNORMAL HIGH
C-ANCA: <1:20 {titer}
P-ANCA: <1:20 {titer}

## 2021-07-19 LAB — ALLERGENS W/TOTAL IGE AREA 2
Alternaria Alternata IgE: <0.1 kU/L
Aspergillus Fumigatus IgE: <0.1 kU/L
Bermuda Grass IgE: <0.1 kU/L
Cat Dander IgE: <0.1 kU/L
Cedar, Mountain IgE: 0.11 kU/L — AB
Cladosporium Herbarum IgE: <0.1 kU/L
Cockroach, German IgE: <0.1 kU/L
Common Silver Birch IgE: <0.1 kU/L
Cottonwood IgE: <0.1 kU/L
D Farinae IgE: <0.1 kU/L
D Pteronyssinus IgE: <0.1 kU/L
Dog Dander IgE: <0.1 kU/L
Elm, American IgE: <0.1 kU/L
IgE (Immunoglobulin E), Serum: 43 IU/mL (ref 6–495)
Johnson Grass IgE: <0.1 kU/L
Maple/Box Elder IgE: <0.1 kU/L
Mouse Urine IgE: <0.1 kU/L
Oak, White IgE: <0.1 kU/L
Pecan, Hickory IgE: <0.1 kU/L
Penicillium Chrysogen IgE: <0.1 kU/L
Pigweed, Rough IgE: <0.1 kU/L
Ragweed, Short IgE: <0.1 kU/L
Sheep Sorrel IgE Qn: <0.1 kU/L
Timothy Grass IgE: <0.1 kU/L
White Mulberry IgE: <0.1 kU/L

## 2021-07-31 ENCOUNTER — Telehealth: Payer: Self-pay

## 2021-07-31 NOTE — Telephone Encounter (Signed)
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  Currently she is being treated by Dr. Vinetta Bergamo, [facility], for sinusitis with antibiotics and currently her arthralgia issue is not active.  I have sent a message to Dr. Vinetta Bergamo noting her positive atypical p-ANCA and made the suggestion that if she flares regarding both her joints and her upper airway disease and is difficult to get these under control then we need to consider the possibility that she has a systemic vasculitic issue and address that issue appropriately.

## 2021-11-27 ENCOUNTER — Telehealth: Payer: Self-pay | Admitting: *Deleted

## 2021-11-27 NOTE — Telephone Encounter (Signed)
L/m needs MD appt for Dupixent reapproval 

## 2022-03-31 ENCOUNTER — Ambulatory Visit: Payer: 59 | Admitting: Family

## 2022-04-22 ENCOUNTER — Encounter: Payer: Self-pay | Admitting: Family Medicine

## 2022-04-22 ENCOUNTER — Ambulatory Visit: Payer: 59 | Admitting: Family Medicine

## 2022-04-22 VITALS — BP 132/88 | HR 94 | Temp 98.0°F | Resp 18 | Ht 62.0 in | Wt 150.0 lb

## 2022-04-22 DIAGNOSIS — J3089 Other allergic rhinitis: Secondary | ICD-10-CM

## 2022-04-22 DIAGNOSIS — J324 Chronic pansinusitis: Secondary | ICD-10-CM | POA: Diagnosis not present

## 2022-04-22 DIAGNOSIS — J339 Nasal polyp, unspecified: Secondary | ICD-10-CM | POA: Diagnosis not present

## 2022-04-22 DIAGNOSIS — R768 Other specified abnormal immunological findings in serum: Secondary | ICD-10-CM

## 2022-04-22 DIAGNOSIS — J4520 Mild intermittent asthma, uncomplicated: Secondary | ICD-10-CM

## 2022-04-22 NOTE — Progress Notes (Addendum)
[facility] [id number] Dept: [phone]  FOLLOW UP NOTE  Patient ID: [name] [name], female    DOB: [birth year]  Age: 51 y.o. MRN: [id number] Date of Office Visit: 04/22/2022  Assessment  Chief Complaint: Allergy Testing (Environmental: All/Food: No Hx), Eczema (Chest area), and Asthma (Exercised induced)  HPI [name] [name] is a 51 year old female who presents to the clinic for a follow-up visit.  She was last seen in this clinic on 07/15/2021 by Dr. Neldon Mc for evaluation of allergic rhinitis, chronic sinusitis, nasal polyposis on Dupixent, and positive ANCA titers followed by rheumatology.  At today's visit, she reports her asthma has been well controlled with no shortness of breath, cough, or wheeze with activity or rest.  She rarely needs to use albuterol with relief of symptoms when needed.  Allergic rhinitis is reported as poorly controlled with symptoms including nasal congestion, sneezing, and postnasal drainage. She continues nasal saline rinses daily and has been using budesonide nasal saline rinses which she has stopped over the last several days. Her last environmental allergy testing was via lab on 07/15/2021 and was equivocal to tree pollen. She has a history of nasal polyposis for which she has previously used Dupixent which resulted in arthralgias at which point she stopped using Dupixent. She continues to follow-up with Dr. Vinetta Bergamo [facility] with her last visit on 03/28/2022.  Atopic dermatitis is reported as poorly controlled with red, itchy areas occurring in a flare and remission pattern for which she continues a daily moisturizing routine. She continues to follow up with Dr. Jari Pigg, dermatology specialist. She reports she reports that she has an upcoming appointment with rheumatology in February for evaluation of positive P-ANCA on 07/15/2021. Her current medications are listed in the chart.    Drug Allergies:  Allergies   Allergen Reactions   Amoxicillin-Pot Clavulanate Anaphylaxis    Augmentin- rash   Ceftriaxone Sodium Anaphylaxis    Rocephin   Gemifloxacin Anaphylaxis   Iodine Anaphylaxis   Quinolones Anaphylaxis   Sulfamethoxazole-Trimethoprim Anaphylaxis and Other (See Comments)   Vancomycin Hives   Erythromycin Diarrhea and Nausea And Vomiting   Azithromycin Hives and Rash   Morphine Itching   Sulfa Antibiotics Hives   Tape Rash and Other (See Comments)    Adhesive tape - blisters blisters   Tapentadol Rash and Other (See Comments)    Adhesive tape - blisters blisters Adhesive tape - blisters blisters     Physical Exam: BP 132/88   Pulse 94   Temp 98 F (36.7 C)   Resp 18   Ht '5\' 2"'$  (1.575 m)   Wt 150 lb (68 kg)   LMP 09/08/2016   SpO2 100%   BMI 27.44 kg/m    Physical Exam Vitals reviewed.  Constitutional:      Appearance: Normal appearance.  HENT:     Head: Normocephalic and atraumatic.     Right Ear: Tympanic membrane normal.     Left Ear: Tympanic membrane normal.     Nose:     Comments: Bilateral nares slightly erythematous with clear nasal drainage noted.  Pharynx normal.  Ears normal.  Eyes normal.    Mouth/Throat:     Pharynx: Oropharynx is clear.  Eyes:     Conjunctiva/sclera: Conjunctivae normal.  Cardiovascular:     Rate and Rhythm: Normal rate and regular rhythm.     Heart sounds: Normal heart sounds. No murmur heard. Pulmonary:     Effort: Pulmonary effort is normal.  Breath sounds: Normal breath sounds.     Comments: Lungs clear to auscultation Musculoskeletal:        General: Normal range of motion.     Cervical back: Normal range of motion and neck supple.  Skin:    General: Skin is warm and dry.     Comments: Patchy macular erythematous areas noted on her abdomen after lying on her stomach for skin prick testing.  Neurological:     Mental Status: She is alert and oriented to person, place, and time.  Psychiatric:        Mood and Affect:  Mood normal.        Behavior: Behavior normal.        Thought Content: Thought content normal.        Judgment: Judgment normal.     Diagnostics: FVC 3.16, FEV1 2.49.  FEV1 is 96.89% of predicted value.  Spirometry indicates normal ventilatory function.  Percutaneous environmental allergy testing was equivocal to red cedar pollen with adequate controls  Food allergy testing was negative to the full panel with adequate controls  Intradermal testing was positive to mite mix with adequate control  Assessment and Plan: 1. Asthma, mild intermittent, well-controlled   2. Perennial allergic rhinitis   3. Nasal polyposis   4. Chronic pansinusitis     Patient Instructions  Allergic rhinitis Your environmental allergy skin testing was positive to dust mites and borderline positive to red cedar tree pollen Continue allergen avoidance measures as listed below Continue cetirizine 10 mg once a day as needed for runny nose or itch Restart budesonide nasal rinses as directed by your ENT specialist Continue nasal saline rinses as needed  Food allergy Your food allergy testing was negative at today's visit  Asthma Continue albuterol 2 puffs once every 4 hours as needed for cough or wheeze  Rash/atopic dermatitis Continue to follow up with your dermatology specialist If your symptoms re-occur, begin a journal of events that occurred for up to 6 hours before your symptoms began including foods and beverages consumed, soaps or perfumes you had contact with, and medications. Consider patch testing  Chronic sinusitis Continue to follow up with your ENT specialist Restart budesonide rinses twice a day as ordered by ENT as noted above  Elevated ANCA titers Keep your upcoming appointment for rheumatology consult  Call the clinic if this treatment plan is not working well for you  Follow up in 2 months or sooner if needed.   Return in about 2 months (around 06/22/2022), or if symptoms worsen  or fail to improve.    Thank you for the opportunity to care for this patient.  Please do not hesitate to contact me with questions.  Gareth Morgan, FNP Allergy and [facility]  I have provided oversight concerning NP evaluation and treatment of this patient's health issues addressed during today's encounter. I agree with the assessment and therapeutic plan as outlined in the note.   Signed,   Jiles Prows, MD,  Allergy and Immunology,  [facility].

## 2022-04-22 NOTE — Patient Instructions (Addendum)
Allergic rhinitis Your environmental allergy skin testing was positive to dust mites and borderline positive to red cedar tree pollen Continue allergen avoidance measures as listed below Continue cetirizine 10 mg once a day as needed for runny nose or itch Restart budesonide nasal rinses as directed by your ENT specialist Continue nasal saline rinses as needed  Food allergy Your food allergy testing was negative at today's visit  Asthma Continue albuterol 2 puffs once every 4 hours as needed for cough or wheeze  Rash Continue to follow up with your dermatology specialist If your symptoms re-occur, begin a journal of events that occurred for up to 6 hours before your symptoms began including foods and beverages consumed, soaps or perfumes you had contact with, and medications. Consider patch testing  Chronic sinusitis Continue to follow up with your ENT specialist Restart budesonide rinses twice a day as ordered by ENT as noted above  Elevated ANCA titers Keep your upcoming appointment for rheumatology consult  Call the clinic if this treatment plan is not working well for you  Follow up in 2 months or sooner if needed.   Control of Dust Mite Allergen Dust mites play a major role in allergic asthma and rhinitis. They occur in environments with high humidity wherever human skin is found. Dust mites absorb humidity from the atmosphere (ie, they do not drink) and feed on organic matter (including shed human and animal skin). Dust mites are a microscopic type of insect that you cannot see with the naked eye. High levels of dust mites have been detected from mattresses, pillows, carpets, upholstered furniture, bed covers, clothes, soft toys and any woven material. The principal allergen of the dust mite is found in its feces. A gram of dust may contain 1,000 mites and 250,000 fecal particles. Mite antigen is easily measured in the air during house cleaning activities. Dust mites do not bite  and do not cause harm to humans, other than by triggering allergies/asthma.  Ways to decrease your exposure to dust mites in your home:  1. Encase mattresses, box springs and pillows with a mite-impermeable barrier or cover  2. Wash sheets, blankets and drapes weekly in hot water (130 F) with detergent and dry them in a dryer on the hot setting.  3. Have the room cleaned frequently with a vacuum cleaner and a damp dust-mop. For carpeting or rugs, vacuuming with a vacuum cleaner equipped with a high-efficiency particulate air (HEPA) filter. The dust mite allergic individual should not be in a room which is being cleaned and should wait 1 hour after cleaning before going into the room.  4. Do not sleep on upholstered furniture (eg, couches).  5. If possible removing carpeting, upholstered furniture and drapery from the home is ideal. Horizontal blinds should be eliminated in the rooms where the person spends the most time (bedroom, study, television room). Washable vinyl, roller-type shades are optimal.  6. Remove all non-washable stuffed toys from the bedroom. Wash stuffed toys weekly like sheets and blankets above.  7. Reduce indoor humidity to less than 50%. Inexpensive humidity monitors can be purchased at most hardware stores. Do not use a humidifier as can make the problem worse and are not recommended.  Reducing Pollen Exposure The American Academy of All[employer] suggests the following steps to reduce your exposure to pollen during allergy seasons. Do not hang sheets or clothing out to dry; pollen may collect on these items. Do not mow lawns or spend time around freshly cut  grass; mowing stirs up pollen. Keep windows closed at night.  Keep car windows closed while driving. Minimize morning activities outdoors, a time when pollen counts are usually at their highest. Stay indoors as much as possible when pollen counts or humidity is high and on windy days when pollen  tends to remain in the air longer. Use air conditioning when possible.  Many air conditioners have filters that trap the pollen spores. Use a HEPA room air filter to remove pollen form the indoor air you breathe.    Allergy: food allergy is when you have eaten a food, developed an allergic reaction after eating the food and have IgE to the food (positive food testing either by skin testing or blood testing).  Food allergy could lead to life threatening symptoms  Sensitivity: occurs when you have IgE to a food (positive food testing either by skin testing or blood testing) but is a food you eat without any issues.  This is not an allergy and we recommend keeping the food in the diet  Intolerance: this is when you have negative testing by either skin testing or blood testing thus not allergic but the food causes symptoms (like belly pain, bloating, diarrhea etc) with ingestion.  These foods should be avoided to prevent symptoms.      Chemicals tested with patch testing in this clinic

## 2022-04-23 DIAGNOSIS — R768 Other specified abnormal immunological findings in serum: Secondary | ICD-10-CM | POA: Insufficient documentation

## 2022-05-19 ENCOUNTER — Ambulatory Visit: Payer: 59 | Admitting: Family

## 2022-05-19 ENCOUNTER — Encounter: Payer: Self-pay | Admitting: Family

## 2022-05-19 ENCOUNTER — Other Ambulatory Visit: Payer: Self-pay

## 2022-05-19 VITALS — BP 120/70 | HR 74 | Temp 98.0°F | Resp 20 | Ht 62.0 in | Wt 149.0 lb

## 2022-05-19 DIAGNOSIS — L239 Allergic contact dermatitis, unspecified cause: Secondary | ICD-10-CM | POA: Diagnosis not present

## 2022-05-19 NOTE — Progress Notes (Unsigned)
   Follow Up Note  RE: [name] [name] MRN: [id number] DOB: 31-Jan-1971 Date of Office Visit: 05/21/2022  Referring provider: Chesley Noon, MD Primary care provider: Chesley Noon, MD  History of Present Illness: I had the pleasure of seeing [name] [name] for a follow up visit at the [facility] on 05/21/2022. She is a 51 y.o. female, who is being followed for rash. Today she is here for initial patch test interpretation, given suspected history of contact dermatitis.   Diagnostics:   TRUE TEST 48 hour reading: positive to gold, indeterminate nickel  T.R.U.E. Test - 05/21/22 1600       Test Information   Time Antigen Placed [place]    Lot # [id number]    Location Back    Number of Test 35    Reading Interval Day 1;Day 3;Day 5    Panel Panel 1;Panel 2;Panel 3      Panel 1   1. Nickel Sulfate --   +/-   2. Wool Alcohols 0    3. Neomycin Sulfate 0    4. Potassium Dichromate 0    5. Caine Mix 0    6. Fragrance Mix 0    7. Colophony 0    8. Paraben Mix 0    9. Negative Control 0    10. Balsam of [place] 0    11. Ethylenediamine Dihydrochloride 0    12. Cobalt Dichloride 0      Panel 2   13. p-tert Butylphenol Formaldehyde Resin 0    14. Epoxy Resin 0    15. Carba Mix 0    16.  Black Rubber Mix 0    17. Cl+ Me-Isothiazolinone 0    18. Quaternium-15 0    19. Methyldibromo Glutaronitrile 0    20. p-Phenylenediamine 0    21. Formaldehyde 0    22. Mercapto Mix 0    23. Thimerosal 0    24. Thiuram Mix 0      Panel 3   25. Diazolidinyl Urea 0    26. Quinoline Mix 0    27. Tixocortol-21-Pivalate 0    28. Gold Sodium Thiosulfate 1    29. Imidazolidinyl Urea 0    30. Budesonide 0    31. Hydrocortisone-17-Butyrate 0    32. Mercaptobenzothiazole 0    33. Bacitracin 0    34. Parthenolide 0    35. Disperse Blue 106 0    36. 2-Bromo-2-Nitropropane-1,3-diol 0              Assessment and Plan: [name] [name] is a 51 y.o. female  with: Concern for Contact Dermatitis:  Meticulous avoidance of these substances is recommended. If avoidance is not possible, the use of barrier creams or lotions is recommended. If symptoms persist or progress despite meticulous avoidance of chemicals/substances above, dermatology evaluation may be warranted. Unclear if these are related to rash based on history.   Follow-up in 2 days.  It was my pleasure to see [name] [name] today and participate in her care. Please feel free to contact me with any questions or concerns.  Sincerely,   Sigurd Sos, MD [facility]

## 2022-05-19 NOTE — Progress Notes (Signed)
Follow-up Note  RE: [name] [name] MRN: [id number] DOB: [birth year] Date of Office Visit: 05/19/2022  Primary care provider: Chesley Noon, MD Referring provider: Chesley Noon, MD   [name] [name] returns to the office today for the patch test placement, given suspected history of contact dermatitis. She has not been on any oral steroids or steroid creams for the past 4 weeks.   Diagnostics: True Test patches placed minus #26 Quinolone Mix due to history of reaction.    Plan:   Allergic contact dermatitis - Instructions provided on care of the patches for the next 48 hours. [name] [name] was instructed to avoid showering for the next 48 hours. [name] [name] will follow up in 48 hours and 96 hours for patch readings.  [name] Charon, FNP [facility]

## 2022-05-21 ENCOUNTER — Ambulatory Visit: Payer: 59 | Admitting: Internal Medicine

## 2022-05-21 ENCOUNTER — Encounter: Payer: Self-pay | Admitting: Internal Medicine

## 2022-05-21 VITALS — Temp 98.3°F

## 2022-05-21 DIAGNOSIS — L2389 Allergic contact dermatitis due to other agents: Secondary | ICD-10-CM

## 2022-05-23 ENCOUNTER — Ambulatory Visit: Payer: 59 | Admitting: Internal Medicine

## 2022-05-23 DIAGNOSIS — L2389 Allergic contact dermatitis due to other agents: Secondary | ICD-10-CM

## 2022-05-23 DIAGNOSIS — L501 Idiopathic urticaria: Secondary | ICD-10-CM

## 2022-05-23 NOTE — Progress Notes (Signed)
Follow Up Note  RE: Lei Dower MRN: [id number] DOB: 1970/08/27 Date of Office Visit: 05/23/2022  Referring provider: Chesley Noon, MD Primary care provider: Chesley Noon, MD  History of Present Illness: I had the pleasure of seeing [name] [name] for a follow up visit at the [facility] on 05/23/2022. She is a 51 y.o. female, who is being followed for contact dermatitis. Today she is here for final patch test interpretation, given suspected history of contact dermatitis.   Diagnostics:  TRUE TEST 96 hour reading:   T.R.U.E. Test - 05/23/22 1000       Test Information   Time Antigen Placed [place]    Lot # [id number]    Location Back    Number of Test 35    Reading Interval Day 5    Panel Panel 1;Panel 2;Panel 3      Panel 1   1. Nickel Sulfate 2    2. Wool Alcohols 0    3. Neomycin Sulfate 0    4. Potassium Dichromate 0    5. Caine Mix 0    6. Fragrance Mix 0    7. Colophony 0    8. Paraben Mix 0    9. Negative Control 0    10. Balsam of [place] 0    11. Ethylenediamine Dihydrochloride 0    12. Cobalt Dichloride 0      Panel 2   13. p-tert Butylphenol Formaldehyde Resin 0    14. Epoxy Resin 0    15. Carba Mix 0    16.  Black Rubber Mix --   +/-   17. Cl+ Me-Isothiazolinone 0    18. Quaternium-15 0    19. Methyldibromo Glutaronitrile 0    20. p-Phenylenediamine 0    21. Formaldehyde 0    22. Mercapto Mix 0    23. Thimerosal 0    24. Thiuram Mix 0      Panel 3   25. Diazolidinyl Urea 0    26. [place] Mix Omitted    27. Tixocortol-21-Pivalate 0    28. Gold Sodium Thiosulfate 2    29. Imidazolidinyl Urea 0    30. Budesonide 0    31. Hydrocortisone-17-Butyrate 0    32. Mercaptobenzothiazole 0    33. Bacitracin 0    34. Parthenolide 0    35. Disperse Blue 106 0    36. 2-Bromo-2-Nitropropane-1,3-diol 0              Assessment and Plan: [name] is a 51 y.o. female with: Concern for Contact  Dermatitis:  Reviewed pictures of symptoms and some look a little urticarial.  Associated with water exposure.  Possible she has hard water and is getting exposed to nickel or gold to her shower.  Could do a trial of low nickel diet. other possibility is aqua genic urticaria.  Recommend starting Zyrtec 10 mg 1-4 times a day.  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  Perhaps urticaria is more autoimmune in nature.  Reassured patient that no matter the underlying etiology if it is urticaria Zyrtec should help control her symptoms.  Recommend follow-up with Dr. Neldon Mc as planned  The patient has been provided detailed information regarding the substances she is sensitive to, as well as products containing the substances.  Meticulous avoidance of these substances is recommended. If avoidance is not possible, the use of barrier creams or lotions is  recommended. If symptoms persist or progress despite meticulous avoidance of chemicals/substances above, dermatology evaluation may be warranted. No follow-ups on file.  It was my pleasure to see [name] today and participate in her care. Please feel free to contact me with any questions or concerns.  Sincerely,   Roney Marion, MD [facility]

## 2023-04-03 ENCOUNTER — Other Ambulatory Visit: Payer: Self-pay | Admitting: Obstetrics and Gynecology

## 2023-04-03 DIAGNOSIS — R928 Other abnormal and inconclusive findings on diagnostic imaging of breast: Secondary | ICD-10-CM

## 2023-04-14 ENCOUNTER — Ambulatory Visit
Admission: RE | Admit: 2023-04-14 | Discharge: 2023-04-14 | Disposition: A | Discharge status: Home / Self Care | Payer: 59 | Source: Ambulatory Visit | Attending: Obstetrics and Gynecology | Admitting: Obstetrics and Gynecology

## 2023-04-14 ENCOUNTER — Ambulatory Visit: Payer: 59

## 2023-04-14 DIAGNOSIS — R928 Other abnormal and inconclusive findings on diagnostic imaging of breast: Secondary | ICD-10-CM
